# Patient Record
Sex: Female | Born: 1960 | Race: White | Hispanic: No | Marital: Married | Smoking: Never smoker
Health system: Southern US, Community
[De-identification: ages and names within clinical notes are randomized; demographics above are authoritative.]

## PROBLEM LIST (undated history)

## (undated) DIAGNOSIS — F40240 Claustrophobia: Secondary | ICD-10-CM

## (undated) DIAGNOSIS — I10 Essential (primary) hypertension: Secondary | ICD-10-CM

## (undated) DIAGNOSIS — D649 Anemia, unspecified: Secondary | ICD-10-CM

## (undated) DIAGNOSIS — T8859XA Other complications of anesthesia, initial encounter: Secondary | ICD-10-CM

## (undated) DIAGNOSIS — R768 Other specified abnormal immunological findings in serum: Secondary | ICD-10-CM

## (undated) DIAGNOSIS — Z9889 Other specified postprocedural states: Secondary | ICD-10-CM

## (undated) DIAGNOSIS — M150 Primary generalized (osteo)arthritis: Secondary | ICD-10-CM

## (undated) DIAGNOSIS — R112 Nausea with vomiting, unspecified: Secondary | ICD-10-CM

## (undated) DIAGNOSIS — E611 Iron deficiency: Secondary | ICD-10-CM

## (undated) DIAGNOSIS — G4730 Sleep apnea, unspecified: Secondary | ICD-10-CM

## (undated) DIAGNOSIS — J45909 Unspecified asthma, uncomplicated: Secondary | ICD-10-CM

## (undated) HISTORY — PX: REVERSE SHOULDER ARTHROPLASTY: SHX5054

## (undated) HISTORY — PX: COLONOSCOPY W/ POLYPECTOMY: SHX1380

## (undated) HISTORY — PX: VENTRAL HERNIA REPAIR: SHX424

## (undated) HISTORY — PX: EYE SURGERY: SHX253

## (undated) HISTORY — PX: ESOPHAGOGASTRODUODENOSCOPY: SHX1529

---

## 2021-01-05 ENCOUNTER — Other Ambulatory Visit: Payer: Self-pay

## 2021-01-05 ENCOUNTER — Encounter: Payer: Self-pay | Admitting: Family Medicine

## 2021-01-05 ENCOUNTER — Ambulatory Visit (INDEPENDENT_AMBULATORY_CARE_PROVIDER_SITE_OTHER): Payer: Medicaid Other | Admitting: Family Medicine

## 2021-01-05 VITALS — BP 146/74 | Ht 62.0 in | Wt 270.0 lb

## 2021-01-05 DIAGNOSIS — M170 Bilateral primary osteoarthritis of knee: Secondary | ICD-10-CM

## 2021-01-05 NOTE — Progress Notes (Signed)
  Lori Moody - 60 y.o. female MRN [id number]  Date of birth: 26-Mar-1961  SUBJECTIVE:  Including CC & ROS.  No chief complaint on file.   Lori Moody is a 60 y.o. female that is presenting with acute on chronic bilateral knee pain.  She has received gel injections in the past.  The last gel injection was January 4.  Has degenerative changes appreciated of each knee that have been revealed on previous imaging.  Has some buckling in the knee from time to time..   Review of Systems See HPI   HISTORY: Past Medical, Surgical, Social, and Family History Reviewed & Updated per EMR.   Pertinent Historical Findings include:  History reviewed. No pertinent past medical history.  History reviewed. No pertinent surgical history.  History reviewed. No pertinent family history.  Social History   Socioeconomic History   Marital status: Unknown    Spouse name: Not on file   Number of children: Not on file   Years of education: Not on file   Highest education level: Not on file  Occupational History   Not on file  Tobacco Use   Smoking status: Not on file   Smokeless tobacco: Not on file  Substance and Sexual Activity   Alcohol use: Not on file   Drug use: Not on file   Sexual activity: Not on file  Other Topics Concern   Not on file  Social History Narrative   Not on file   Social Determinants of Health   Financial Resource Strain: Not on file  Food Insecurity: Not on file  Transportation Needs: Not on file  Physical Activity: Not on file  Stress: Not on file  Social Connections: Not on file  Intimate Partner Violence: Not on file     PHYSICAL EXAM:  VS: BP (!) 146/74 (BP Location: Left Arm, Patient Position: Sitting, Cuff Size: Large)   Ht 5\' 2"  (1.575 m)   Wt 270 lb (122.5 kg)   BMI 49.38 kg/m  Physical Exam Gen: NAD, alert, cooperative with exam, well-appearing MSK:  Right and left knee: Normal range of motion. Tenderness to palpation along the joint  space. Normal strength resistance. Neurovascular intact     ASSESSMENT & PLAN:   Primary osteoarthritis of both knees Acute on chronic in nature.  Has done well with previous gel injections.  Last gel injection was January 4.  Degenerative changes appreciated on imaging. -Counseled on home exercise therapy and supportive care. -Pursue gel injection. -Hinged knee brace. -Could consider physical therapy or PRP.

## 2021-01-05 NOTE — Patient Instructions (Signed)
Nice to meet you Please try ice as needed  Please try the exercises   Please send me a message in MyChart with any questions or updates.  We will call once the gel injections are in.   --Dr. Jordan Likes

## 2021-01-06 DIAGNOSIS — M170 Bilateral primary osteoarthritis of knee: Secondary | ICD-10-CM | POA: Insufficient documentation

## 2021-01-06 NOTE — Assessment & Plan Note (Signed)
Acute on chronic in nature.  Has done well with previous gel injections.  Last gel injection was January 4.  Degenerative changes appreciated on imaging. -Counseled on home exercise therapy and supportive care. -Pursue gel injection. -Hinged knee brace. -Could consider physical therapy or PRP.

## 2021-01-07 ENCOUNTER — Telehealth: Payer: Self-pay | Admitting: *Deleted

## 2021-01-07 NOTE — Telephone Encounter (Signed)
Received SOB for pt's Gelsyn-3. Her plan covers the medication 100% and admin coverage 100%. I informed pt she can contact her plan as well to confirm benefits.   She wanted to ask for a specific different gel injection. I informed her Dr. Jordan Likes wanted her to have Gelsyn-3. She agreed to having her 1st Gelsyn on 01/19/21. OV scheduled.

## 2021-01-19 ENCOUNTER — Ambulatory Visit: Payer: Self-pay

## 2021-01-19 ENCOUNTER — Other Ambulatory Visit: Payer: Self-pay

## 2021-01-19 ENCOUNTER — Encounter: Payer: Self-pay | Admitting: Family Medicine

## 2021-01-19 ENCOUNTER — Ambulatory Visit (INDEPENDENT_AMBULATORY_CARE_PROVIDER_SITE_OTHER): Payer: Medicaid Other | Admitting: Family Medicine

## 2021-01-19 VITALS — BP 140/90 | Ht 62.0 in | Wt 270.0 lb

## 2021-01-19 DIAGNOSIS — M170 Bilateral primary osteoarthritis of knee: Secondary | ICD-10-CM

## 2021-01-19 NOTE — Patient Instructions (Signed)
Good to see you Please use ice as needed  Please send me a message in MyChart with any questions or updates.  Please see me back in 1 week.   --Dr. Donicia Druck  

## 2021-01-19 NOTE — Progress Notes (Signed)
Lori Moody - 60 y.o. female MRN [id number]  Date of birth: 11-19-1960  SUBJECTIVE:  Including CC & ROS.  No chief complaint on file.   Lori Moody is a 60 y.o. female that is presenting for gel injections.    Review of Systems See HPI   HISTORY: Past Medical, Surgical, Social, and Family History Reviewed & Updated per EMR.   Pertinent Historical Findings include:  History reviewed. No pertinent past medical history.  History reviewed. No pertinent surgical history.  History reviewed. No pertinent family history.  Social History   Socioeconomic History   Marital status: Unknown    Spouse name: Not on file   Number of children: Not on file   Years of education: Not on file   Highest education level: Not on file  Occupational History   Not on file  Tobacco Use   Smoking status: Not on file   Smokeless tobacco: Not on file  Substance and Sexual Activity   Alcohol use: Not on file   Drug use: Not on file   Sexual activity: Not on file  Other Topics Concern   Not on file  Social History Narrative   Not on file   Social Determinants of Health   Financial Resource Strain: Not on file  Food Insecurity: Not on file  Transportation Needs: Not on file  Physical Activity: Not on file  Stress: Not on file  Social Connections: Not on file  Intimate Partner Violence: Not on file     PHYSICAL EXAM:  VS: BP 140/90 (BP Location: Left Arm, Patient Position: Sitting, Cuff Size: Large)   Ht 5\' 2"  (1.575 m)   Wt 270 lb (122.5 kg)   BMI 49.38 kg/m  Physical Exam Gen: NAD, alert, cooperative with exam, well-appearing    Aspiration/Injection Procedure Note Lori Moody 09/15/1960  Procedure: Injection Indications: left knee pain   Procedure Details Consent: Risks of procedure as well as the alternatives and risks of each were explained to the (patient/caregiver).  Consent for procedure obtained. Time Out: Verified patient identification, verified procedure,  site/side was marked, verified correct patient position, special equipment/implants available, medications/allergies/relevent history reviewed, required imaging and test results available.  Performed.  The area was cleaned with iodine and alcohol swabs.    The left  knee superior lateral suprapatellar pouch was injected using 4 cc's of 1% lidocaine with a 22 1 1/2" needle.  The syringe was switched and a 16.8 mg/2 mL of Gelsyn 3 was injected. Ultrasound was used. Images were obtained in  Long views showing the injection.    A sterile dressing was applied.  Patient did tolerate procedure well.  Aspiration/Injection Procedure Note Lori Moody [birth year]  Procedure: Injection Indications: right knee pain   Procedure Details Consent: Risks of procedure as well as the alternatives and risks of each were explained to the (patient/caregiver).  Consent for procedure obtained. Time Out: Verified patient identification, verified procedure, site/side was marked, verified correct patient position, special equipment/implants available, medications/allergies/relevent history reviewed, required imaging and test results available.  Performed.  The area was cleaned with iodine and alcohol swabs.    The right knee superior lateral suprapatellar pouch was injected using 4 cc's of 1% lidocaine with a 22 1 1/2" needle.  The syringe was switched and a 16.8 mg/2 mL of Gelsyn 3 was injected. Ultrasound was used. Images were obtained in  Long views showing the injection.    A sterile dressing was applied.  Patient did tolerate procedure well.  ASSESSMENT & PLAN:   Primary osteoarthritis of both knees Completed Gelsyn injection today. - f/u in one week.

## 2021-01-19 NOTE — Assessment & Plan Note (Signed)
Completed Gelsyn injection today. - f/u in one week.

## 2021-01-26 ENCOUNTER — Encounter: Payer: Self-pay | Admitting: Family Medicine

## 2021-01-26 ENCOUNTER — Ambulatory Visit: Payer: Self-pay

## 2021-01-26 ENCOUNTER — Other Ambulatory Visit: Payer: Self-pay

## 2021-01-26 ENCOUNTER — Ambulatory Visit (INDEPENDENT_AMBULATORY_CARE_PROVIDER_SITE_OTHER): Payer: Medicaid Other | Admitting: Family Medicine

## 2021-01-26 DIAGNOSIS — M170 Bilateral primary osteoarthritis of knee: Secondary | ICD-10-CM

## 2021-01-26 NOTE — Patient Instructions (Signed)
Good to see you Please try ice  Please send me a message in MyChart with any questions or updates.  Please see me back in 1 week.   --Dr. Edwin Cherian  

## 2021-01-26 NOTE — Progress Notes (Signed)
Lori Moody - 60 y.o. female MRN [id number]  Date of birth: [birth year]  SUBJECTIVE:  Including CC & ROS.  No chief complaint on file.   Lori Moody is a 60 y.o. female that is  here for second gel injection.    Review of Systems See HPI   HISTORY: Past Medical, Surgical, Social, and Family History Reviewed & Updated per EMR.   Pertinent Historical Findings include:  History reviewed. No pertinent past medical history.  History reviewed. No pertinent surgical history.  History reviewed. No pertinent family history.  Social History   Socioeconomic History   Marital status: Unknown    Spouse name: Not on file   Number of children: Not on file   Years of education: Not on file   Highest education level: Not on file  Occupational History   Not on file  Tobacco Use   Smoking status: Not on file   Smokeless tobacco: Not on file  Substance and Sexual Activity   Alcohol use: Not on file   Drug use: Not on file   Sexual activity: Not on file  Other Topics Concern   Not on file  Social History Narrative   Not on file   Social Determinants of Health   Financial Resource Strain: Not on file  Food Insecurity: Not on file  Transportation Needs: Not on file  Physical Activity: Not on file  Stress: Not on file  Social Connections: Not on file  Intimate Partner Violence: Not on file     PHYSICAL EXAM:  VS: BP 130/82 (BP Location: Left Arm, Patient Position: Sitting, Cuff Size: Large)   Ht 5\' 2"  (1.575 m)   Wt 270 lb (122.5 kg)   BMI 49.38 kg/m  Physical Exam Gen: NAD, alert, cooperative with exam, well-appearing    Aspiration/Injection Procedure Note Lori Moody Feb 20, 1961  Procedure: Injection Indications: left knee pain   Procedure Details Consent: Risks of procedure as well as the alternatives and risks of each were explained to the (patient/caregiver).  Consent for procedure obtained. Time Out: Verified patient identification, verified procedure,  site/side was marked, verified correct patient position, special equipment/implants available, medications/allergies/relevent history reviewed, required imaging and test results available.  Performed.  The area was cleaned with iodine and alcohol swabs.    The left knee superior lateral suprapatellar pouch was injected using 4 cc's of 1% lidocaine with a 22 1 1/2" needle.  The syringe was switched and a 16.8 mg/2 mL of Gelsyn 3 was injected. Ultrasound was used. Images were obtained in  Long views showing the injection.    A sterile dressing was applied.  Patient did tolerate procedure well.  Aspiration/Injection Procedure Note Lori Moody 03-15-1961  Procedure: Injection Indications: right knee pain   Procedure Details Consent: Risks of procedure as well as the alternatives and risks of each were explained to the (patient/caregiver).  Consent for procedure obtained. Time Out: Verified patient identification, verified procedure, site/side was marked, verified correct patient position, special equipment/implants available, medications/allergies/relevent history reviewed, required imaging and test results available.  Performed.  The area was cleaned with iodine and alcohol swabs.    The right knee superior lateral suprapatellar pouch was injected using 4 cc's of 1% lidocaine with a 22 1 1/2" needle.  The syringe was switched and a 16.8 mg/2 mL of Gelsyn 3 was injected. Ultrasound was used. Images were obtained in  Long views showing the injection.    A sterile dressing was applied.  Patient did tolerate procedure well.  ASSESSMENT & PLAN:   Primary osteoarthritis of both knees Completed 2nd gel injection  - f/u in one week.

## 2021-01-26 NOTE — Assessment & Plan Note (Signed)
Completed 2nd gel injection  - f/u in one week.

## 2021-01-26 NOTE — Addendum Note (Signed)
Addended by: Annita Brod on: 01/26/2021 03:52 PM   Modules accepted: Orders

## 2021-02-02 ENCOUNTER — Ambulatory Visit (INDEPENDENT_AMBULATORY_CARE_PROVIDER_SITE_OTHER): Payer: Medicaid Other | Admitting: Family Medicine

## 2021-02-02 ENCOUNTER — Encounter: Payer: Self-pay | Admitting: Family Medicine

## 2021-02-02 ENCOUNTER — Ambulatory Visit: Payer: Self-pay

## 2021-02-02 ENCOUNTER — Other Ambulatory Visit: Payer: Self-pay

## 2021-02-02 VITALS — BP 148/82 | Ht 62.0 in | Wt 270.0 lb

## 2021-02-02 DIAGNOSIS — M170 Bilateral primary osteoarthritis of knee: Secondary | ICD-10-CM

## 2021-02-02 NOTE — Assessment & Plan Note (Signed)
Completed third gelsyn injection today - provided pennsaid samples  - counseled on home exercise therapy and supportive care

## 2021-02-02 NOTE — Progress Notes (Signed)
  Lori Moody - 60 y.o. female MRN [id number]  Date of birth: 12-Feb-1961  SUBJECTIVE:  Including CC & ROS.  No chief complaint on file.   Lori Moody is a 60 y.o. female that is  is her for third gel injection.   Review of Systems See HPI   HISTORY: Past Medical, Surgical, Social, and Family History Reviewed & Updated per EMR.   Pertinent Historical Findings include:  No past medical history on file.  No past surgical history on file.  No family history on file.  Social History   Socioeconomic History   Marital status: Married    Spouse name: Not on file   Number of children: Not on file   Years of education: Not on file   Highest education level: Not on file  Occupational History   Not on file  Tobacco Use   Smoking status: Not on file   Smokeless tobacco: Not on file  Substance and Sexual Activity   Alcohol use: Not on file   Drug use: Not on file   Sexual activity: Not on file  Other Topics Concern   Not on file  Social History Narrative   Not on file   Social Determinants of Health   Financial Resource Strain: Not on file  Food Insecurity: Not on file  Transportation Needs: Not on file  Physical Activity: Not on file  Stress: Not on file  Social Connections: Not on file  Intimate Partner Violence: Not on file     PHYSICAL EXAM:  VS: There were no vitals taken for this visit. Physical Exam Gen: NAD, alert, cooperative with exam, well-appearing    Aspiration/Injection Procedure Note Lori Moody [birth year]  Procedure: Injection Indications: left knee pain   Procedure Details Consent: Risks of procedure as well as the alternatives and risks of each were explained to the (patient/caregiver).  Consent for procedure obtained. Time Out: Verified patient identification, verified procedure, site/side was marked, verified correct patient position, special equipment/implants available, medications/allergies/relevent history reviewed, required imaging  and test results available.  Performed.  The area was cleaned with iodine and alcohol swabs.    The left knee superior lateral suprapatellar pouch was injected using 4 cc's of 1% lidocaine with a 22 1 1/2" needle.  The syringe was switched and a 16.8 mg/2 mL of Gelsyn 3 was injected. Ultrasound was used. Images were obtained in  Long views showing the injection.    A sterile dressing was applied.  Patient did tolerate procedure well.  Aspiration/Injection Procedure Note Lori Moody Jul 01, 1961  Procedure: Injection Indications: right knee pain   Procedure Details Consent: Risks of procedure as well as the alternatives and risks of each were explained to the (patient/caregiver).  Consent for procedure obtained. Time Out: Verified patient identification, verified procedure, site/side was marked, verified correct patient position, special equipment/implants available, medications/allergies/relevent history reviewed, required imaging and test results available.  Performed.  The area was cleaned with iodine and alcohol swabs.    The right knee superior lateral suprapatellar pouch was injected using 4 cc's of 1% lidocaine with a 22 1 1/2" needle.  The syringe was switched and a 16.8 mg/2 mL of Gelsyn 3 was injected. Ultrasound was used. Images were obtained in  Long views showing the injection.    A sterile dressing was applied.  Patient did tolerate procedure well.   ASSESSMENT & PLAN:   No problem-specific Assessment & Plan notes found for this encounter.

## 2021-02-02 NOTE — Patient Instructions (Signed)
Good to see you Please try ice   Please send me a message in MyChart with any questions or updates.  Please see me back in 4 weeks.   --Dr. Harbour Nordmeyer  

## 2021-05-09 ENCOUNTER — Ambulatory Visit: Payer: Medicaid Other | Admitting: Family Medicine

## 2021-05-10 ENCOUNTER — Ambulatory Visit: Payer: Medicaid Other | Admitting: Family Medicine

## 2021-05-10 NOTE — Progress Notes (Deleted)
  Lori Moody - 60 y.o. female MRN [id number]  Date of birth: 1960/09/14  SUBJECTIVE:  Including CC & ROS.  No chief complaint on file.   Lori Moody is a 60 y.o. female that is  ***.  ***   Review of Systems See HPI   HISTORY: Past Medical, Surgical, Social, and Family History Reviewed & Updated per EMR.   Pertinent Historical Findings include:  No past medical history on file.  No past surgical history on file.  No family history on file.  Social History   Socioeconomic History   Marital status: Married    Spouse name: Not on file   Number of children: Not on file   Years of education: Not on file   Highest education level: Not on file  Occupational History   Not on file  Tobacco Use   Smoking status: Not on file   Smokeless tobacco: Not on file  Substance and Sexual Activity   Alcohol use: Not on file   Drug use: Not on file   Sexual activity: Not on file  Other Topics Concern   Not on file  Social History Narrative   Not on file   Social Determinants of Health   Financial Resource Strain: Not on file  Food Insecurity: Not on file  Transportation Needs: Not on file  Physical Activity: Not on file  Stress: Not on file  Social Connections: Not on file  Intimate Partner Violence: Not on file     PHYSICAL EXAM:  VS: There were no vitals taken for this visit. Physical Exam Gen: NAD, alert, cooperative with exam, well-appearing MSK:  ***      ASSESSMENT & PLAN:   No problem-specific Assessment & Plan notes found for this encounter.

## 2021-05-11 ENCOUNTER — Telehealth: Payer: Self-pay | Admitting: Family Medicine

## 2021-05-11 DIAGNOSIS — S39012A Strain of muscle, fascia and tendon of lower back, initial encounter: Secondary | ICD-10-CM

## 2021-05-11 NOTE — Telephone Encounter (Signed)
Patient having radicular pain with low back pain.  We will place future lumbar spine x-rays  Myra Rude, MD [facility] 05/11/2021, 1:46 PM

## 2021-05-13 ENCOUNTER — Ambulatory Visit (INDEPENDENT_AMBULATORY_CARE_PROVIDER_SITE_OTHER): Payer: Medicaid Other | Admitting: Family Medicine

## 2021-05-13 ENCOUNTER — Ambulatory Visit (HOSPITAL_BASED_OUTPATIENT_CLINIC_OR_DEPARTMENT_OTHER)
Admission: RE | Admit: 2021-05-13 | Discharge: 2021-05-13 | Disposition: A | Discharge status: Home / Self Care | Payer: Medicaid Other | Source: Ambulatory Visit | Attending: Family Medicine | Admitting: Family Medicine

## 2021-05-13 ENCOUNTER — Other Ambulatory Visit: Payer: Self-pay

## 2021-05-13 ENCOUNTER — Encounter: Payer: Self-pay | Admitting: Family Medicine

## 2021-05-13 VITALS — BP 134/72 | Ht 62.0 in | Wt 266.0 lb

## 2021-05-13 DIAGNOSIS — M5416 Radiculopathy, lumbar region: Secondary | ICD-10-CM | POA: Diagnosis not present

## 2021-05-13 DIAGNOSIS — S39012A Strain of muscle, fascia and tendon of lower back, initial encounter: Secondary | ICD-10-CM | POA: Insufficient documentation

## 2021-05-13 DIAGNOSIS — M47819 Spondylosis without myelopathy or radiculopathy, site unspecified: Secondary | ICD-10-CM | POA: Insufficient documentation

## 2021-05-13 NOTE — Assessment & Plan Note (Signed)
Has changes of the SI joints which could be contributing to the pain that she is experiencing. -Counseled on home exercise therapy and supportive care. -Referral to physical therapy. -Could consider further imaging

## 2021-05-13 NOTE — Progress Notes (Signed)
  Lori Moody - 60 y.o. female MRN [id number]  Date of birth: Jun 11, 1961  SUBJECTIVE:  Including CC & ROS.  No chief complaint on file.   Lori Moody is a 60 y.o. female that is presenting with acute on chronic low back pain with radicular pain down the left leg.  Symptoms are acutely occurring.  No history of surgery..  Independent review of the lumbar spine x-ray from today shows degenerative changes of the SI joint in the thoracic with excessive kyphosis   Review of Systems See HPI   HISTORY: Past Medical, Surgical, Social, and Family History Reviewed & Updated per EMR.   Pertinent Historical Findings include:  History reviewed. No pertinent past medical history.  History reviewed. No pertinent surgical history.  History reviewed. No pertinent family history.  Social History   Socioeconomic History   Marital status: Married    Spouse name: Not on file   Number of children: Not on file   Years of education: Not on file   Highest education level: Not on file  Occupational History   Not on file  Tobacco Use   Smoking status: Not on file   Smokeless tobacco: Not on file  Substance and Sexual Activity   Alcohol use: Not on file   Drug use: Not on file   Sexual activity: Not on file  Other Topics Concern   Not on file  Social History Narrative   Not on file   Social Determinants of Health   Financial Resource Strain: Not on file  Food Insecurity: Not on file  Transportation Needs: Not on file  Physical Activity: Not on file  Stress: Not on file  Social Connections: Not on file  Intimate Partner Violence: Not on file     PHYSICAL EXAM:  VS: BP 134/72 (BP Location: Left Arm, Patient Position: Sitting)   Ht 5\' 2"  (1.575 m)   Wt 266 lb (120.7 kg)   BMI 48.65 kg/m  Physical Exam Gen: NAD, alert, cooperative with exam, well-appearing      ASSESSMENT & PLAN:   Lumbar radiculopathy Has changes of the SI joints which could be contributing to the pain  that she is experiencing. -Counseled on home exercise therapy and supportive care. -Referral to physical therapy. -Could consider further imaging

## 2021-05-13 NOTE — Patient Instructions (Signed)
Good to see you Please use heat  Please try the exercises  Please try physical therapy   Please send me a message in MyChart with any questions or updates.  Please see me back in 6 weeks.   --Dr. Jordan Likes

## 2021-05-18 ENCOUNTER — Telehealth: Payer: Self-pay | Admitting: Family Medicine

## 2021-05-18 NOTE — Telephone Encounter (Signed)
Unable to leave VM for patient. If she calls back please have her speak with a nurse/CMA and inform that her xray is showing the degenerative changes in the thoracic spine and the excessive curve in the lumbar region.   If any questions then please take the best time and phone number to call and I will try to call her back.   Myra Rude, MD [facility] 05/18/2021, 8:12 AM

## 2021-06-29 ENCOUNTER — Ambulatory Visit: Payer: Medicaid Other | Admitting: Family Medicine

## 2021-08-01 ENCOUNTER — Ambulatory Visit: Payer: Medicaid Other | Admitting: Family Medicine

## 2021-08-08 ENCOUNTER — Ambulatory Visit (INDEPENDENT_AMBULATORY_CARE_PROVIDER_SITE_OTHER): Payer: Medicaid Other | Admitting: Family Medicine

## 2021-08-08 ENCOUNTER — Encounter: Payer: Self-pay | Admitting: Family Medicine

## 2021-08-08 VITALS — BP 130/62 | Ht 62.0 in | Wt 266.0 lb

## 2021-08-08 DIAGNOSIS — M5416 Radiculopathy, lumbar region: Secondary | ICD-10-CM | POA: Diagnosis not present

## 2021-08-08 DIAGNOSIS — M170 Bilateral primary osteoarthritis of knee: Secondary | ICD-10-CM

## 2021-08-08 NOTE — Progress Notes (Addendum)
°  Lori Moody - 61 y.o. female MRN [id number]  Date of birth: 1960-11-22  SUBJECTIVE:  Including CC & ROS.  No chief complaint on file.   Lori Moody is a 61 y.o. female that is presenting with acute on chronic bilateral knee pain.  She has done well with previous gel injections.  She is also presenting with acute on chronic low back pain with radicular pain down the left leg.  She has completed more than 6 weeks of home exercise program that was physician directed.  Has tried medications and therapy.  Independent review of the lumbar spine x-ray from 05/13/2021 shows mild facet hypertrophy.  Review of Systems See HPI   HISTORY: Past Medical, Surgical, Social, and Family History Reviewed & Updated per EMR.   Pertinent Historical Findings include:  History reviewed. No pertinent past medical history.  History reviewed. No pertinent surgical history.   PHYSICAL EXAM:  VS: BP 130/62 (BP Location: Left Arm, Patient Position: Sitting)    Ht 5\' 2"  (1.575 m)    Wt 266 lb (120.7 kg)    BMI 48.65 kg/m  Physical Exam Gen: NAD, alert, cooperative with exam, well-appearing MSK: Neurovascularly intact       ASSESSMENT & PLAN:   Lumbar radiculopathy Acute on chronic in nature.  Having radicular pain that is suggestive of nerve impingement.  She has received osteopathic manipulation therapy starting 06/24/2021 to present.  She has been having home exercise supervision by physician starting 05/13/2021 to present.  Having altered sensation in the lower leg with weakness of the left foot. -Counseled on home exercise therapy and supportive care. - MRI of lumbar spine to evaluate for nerve impingement and consideration of epidural    Primary osteoarthritis of both knees Acute on chronic in nature.  - counseled on home exercise therapy and supportive care - pursue gel injections.

## 2021-08-08 NOTE — Assessment & Plan Note (Signed)
Acute on chronic in nature.  - counseled on home exercise therapy and supportive care - pursue gel injections.

## 2021-08-08 NOTE — Assessment & Plan Note (Addendum)
Acute on chronic in nature.  Having radicular pain that is suggestive of nerve impingement.  She has received osteopathic manipulation therapy starting 06/24/2021 to present.  She has been having home exercise supervision by physician starting 05/13/2021 to present.  Having altered sensation in the lower leg with weakness of the left foot. -Counseled on home exercise therapy and supportive care. - MRI of lumbar spine to evaluate for nerve impingement and consideration of epidural

## 2021-08-08 NOTE — Patient Instructions (Signed)
Good to see you We'll call you when the synvisc is in.   Please send me a message in MyChart with any questions or updates.  We'll setup a virtual visit once the MRi is resulted.   --Dr. Jordan Likes

## 2021-08-10 ENCOUNTER — Other Ambulatory Visit: Payer: Self-pay | Admitting: *Deleted

## 2021-08-15 ENCOUNTER — Ambulatory Visit: Payer: Medicaid Other | Admitting: Family Medicine

## 2021-08-18 ENCOUNTER — Encounter: Payer: Self-pay | Admitting: Family Medicine

## 2021-08-19 ENCOUNTER — Ambulatory Visit: Payer: Medicaid Other | Admitting: Family Medicine

## 2021-08-22 ENCOUNTER — Telehealth: Payer: Self-pay | Admitting: *Deleted

## 2021-08-22 ENCOUNTER — Ambulatory Visit: Payer: Medicaid Other | Admitting: Family Medicine

## 2021-08-22 NOTE — Telephone Encounter (Signed)
I called [employer]- spoke with Earlie Server.  Synvisc bilateral knee injections approved 08/19/21-10/17/21. PA # [id number]. Patient is scheduled 08/23/21.

## 2021-08-23 ENCOUNTER — Ambulatory Visit: Payer: Self-pay

## 2021-08-23 ENCOUNTER — Encounter: Payer: Self-pay | Admitting: Family Medicine

## 2021-08-23 ENCOUNTER — Ambulatory Visit (INDEPENDENT_AMBULATORY_CARE_PROVIDER_SITE_OTHER): Payer: Medicaid Other | Admitting: Family Medicine

## 2021-08-23 VITALS — BP 140/72 | Ht 62.0 in | Wt 266.0 lb

## 2021-08-23 DIAGNOSIS — M170 Bilateral primary osteoarthritis of knee: Secondary | ICD-10-CM

## 2021-08-23 MED ORDER — DICLOFENAC SODIUM 2 % EX SOLN: 1.0000 "application " | Freq: Two times a day (BID) | CUTANEOUS | 2 refills | Status: DC

## 2021-08-23 NOTE — Assessment & Plan Note (Signed)
Completed gel injections today.

## 2021-08-23 NOTE — Progress Notes (Signed)
°  Lori Moody - 61 y.o. female MRN [id number]  Date of birth: 04/13/1961  SUBJECTIVE:  Including CC & ROS.  No chief complaint on file.   Lori Moody is a 61 y.o. female that is here for gel injection.   Review of Systems See HPI   HISTORY: Past Medical, Surgical, Social, and Family History Reviewed & Updated per EMR.   Pertinent Historical Findings include:  History reviewed. No pertinent past medical history.  History reviewed. No pertinent surgical history.   PHYSICAL EXAM:  VS: BP 140/72 (BP Location: Left Arm, Patient Position: Sitting)    Ht 5\' 2"  (1.575 m)    Wt 266 lb (120.7 kg)    BMI 48.65 kg/m  Physical Exam Gen: NAD, alert, cooperative with exam, well-appearing MSK:  Neurovascularly intact     Aspiration/Injection Procedure Note Lori Moody 05-26-1961  Procedure: Injection Indications: left knee pain   Procedure Details Consent: Risks of procedure as well as the alternatives and risks of each were explained to the (patient/caregiver).  Consent for procedure obtained. Time Out: Verified patient identification, verified procedure, site/side was marked, verified correct patient position, special equipment/implants available, medications/allergies/relevent history reviewed, required imaging and test results available.  Performed.  The area was cleaned with iodine and alcohol swabs.    The left knee superior lateral suprapatellar pouch was injected using 4 cc's of 1% lidocaine with a 21 2" needle.  The syringe was switched and a 41mL of synvisc was injected. Ultrasound was used. Images were obtained in  Long views showing the injection.    A sterile dressing was applied.  Patient did tolerate procedure well.  Aspiration/Injection Procedure Note Lori Moody 10/09/1960  Procedure: Injection Indications: right knee pain   Procedure Details Consent: Risks of procedure as well as the alternatives and risks of each were explained to the  (patient/caregiver).  Consent for procedure obtained. Time Out: Verified patient identification, verified procedure, site/side was marked, verified correct patient position, special equipment/implants available, medications/allergies/relevent history reviewed, required imaging and test results available.  Performed.  The area was cleaned with iodine and alcohol swabs.    The right knee superior lateral suprapatellar pouch was injected using 4 cc's of 1% lidocaine with a 21 2" needle.  The syringe was switched and a 46mL of synvisc was injected. Ultrasound was used. Images were obtained in  Long views showing the injection.    A sterile dressing was applied.  Patient did tolerate procedure well.  ASSESSMENT & PLAN:   Primary osteoarthritis of both knees Completed gel injections today.

## 2021-08-23 NOTE — Patient Instructions (Signed)
Good to see you Please use ice as needed  Please send me a message in MyChart with any questions or updates.  Please see me back in 1 week.   --Dr. Kamden Stanislaw  

## 2021-08-30 ENCOUNTER — Ambulatory Visit: Payer: Self-pay

## 2021-08-30 ENCOUNTER — Encounter: Payer: Self-pay | Admitting: Family Medicine

## 2021-08-30 ENCOUNTER — Ambulatory Visit (INDEPENDENT_AMBULATORY_CARE_PROVIDER_SITE_OTHER): Payer: Medicaid Other | Admitting: Family Medicine

## 2021-08-30 VITALS — BP 138/72 | Ht 62.0 in | Wt 266.0 lb

## 2021-08-30 DIAGNOSIS — M170 Bilateral primary osteoarthritis of knee: Secondary | ICD-10-CM

## 2021-08-30 NOTE — Assessment & Plan Note (Signed)
Gel injections today ° °

## 2021-08-30 NOTE — Progress Notes (Signed)
°  Lori Moody - 61 y.o. female MRN [id number]  Date of birth: October 11, 1960  SUBJECTIVE:  Including CC & ROS.  No chief complaint on file.   Lori Moody is a 61 y.o. female that is here for gel injections.    Review of Systems See HPI   HISTORY: Past Medical, Surgical, Social, and Family History Reviewed & Updated per EMR.   Pertinent Historical Findings include:  History reviewed. No pertinent past medical history.  History reviewed. No pertinent surgical history.   PHYSICAL EXAM:  VS: BP 138/72 (BP Location: Left Arm, Patient Position: Sitting)    Ht 5\' 2"  (1.575 m)    Wt 266 lb (120.7 kg)    BMI 48.65 kg/m  Physical Exam Gen: NAD, alert, cooperative with exam, well-appearing MSK:  Neurovascularly intact    Aspiration/Injection Procedure Note Lori Moody 1960/12/22   Procedure: Injection Indications: left knee pain    Procedure Details Consent: Risks of procedure as well as the alternatives and risks of each were explained to the (patient/caregiver).  Consent for procedure obtained. Time Out: Verified patient identification, verified procedure, site/side was marked, verified correct patient position, special equipment/implants available, medications/allergies/relevent history reviewed, required imaging and test results available.  Performed.  The area was cleaned with iodine and alcohol swabs.     The left knee superior lateral suprapatellar pouch was injected using 4 cc's of 1% lidocaine with a 21 2" needle.  The syringe was switched and a 93mL of synvisc was injected. Ultrasound was used. Images were obtained in  Long views showing the injection.     A sterile dressing was applied.   Patient did tolerate procedure well.  Aspiration/Injection Procedure Note Lori Moody 1960-07-26   Procedure: Injection Indications: right knee pain    Procedure Details Consent: Risks of procedure as well as the alternatives and risks of each were explained to the  (patient/caregiver).  Consent for procedure obtained. Time Out: Verified patient identification, verified procedure, site/side was marked, verified correct patient position, special equipment/implants available, medications/allergies/relevent history reviewed, required imaging and test results available.  Performed.  The area was cleaned with iodine and alcohol swabs.     The right knee superior lateral suprapatellar pouch was injected using 4 cc's of 1% lidocaine with a 21 2" needle.  The syringe was switched and a 84mL of synvisc was injected. Ultrasound was used. Images were obtained in  Long views showing the injection.     A sterile dressing was applied.   Patient did tolerate procedure well.   ASSESSMENT & PLAN:   Primary osteoarthritis of both knees Gel injections today.

## 2021-08-30 NOTE — Patient Instructions (Signed)
Good to see you Please use ice as needed  Please send me a message in MyChart with any questions or updates.  Please see me back in 1 week.   --Dr. Elize Pinon  

## 2021-09-02 ENCOUNTER — Other Ambulatory Visit: Payer: Self-pay

## 2021-09-02 ENCOUNTER — Encounter (HOSPITAL_COMMUNITY): Payer: Self-pay | Admitting: *Deleted

## 2021-09-02 NOTE — Progress Notes (Addendum)
Lori Moody denies chest pain or shortness of breath. Patient denies having any s/s of Covid in her household.  Patient denies any known exposure to Covid. Lori Moody does experience shortness of breath when Iron stores are low. Patient is having a Iron transfusion today.  Lori Moody will complete a round of Augmentin on Monday, patient  is taking it for sinuitis. Lori Moody was seen at a [facility] not her PCP's office. Patient states she feels better.   Lori Moody has a history of HTN, patient was taking Losartan until late January 2023, patient complained of dizziness, she felt as if she was going to pass out. Lori Moody was seen by PCP, Dr. Porfirio Mylar  on 07/3021.  Patient said that PCP instructed her to stop Losartan he ordered an ECHO , and labs to be done- in the future. Lori Moody states that she is scheduled for ECHO on 09/12/21. I spoke with Rica Mast, NP, she is reviewing chart.  Lori Moody has a history Type II diabetes, Prediabetes; patient said she has neither now.  The last A1C I found was from 01/2021, it was 5.4. A1C is a lab that PCP ordered for the future. Lori Moody checks CBG 2 times a week, it runs around 80. I instructed patient to check CBG after awaking and every 2 hours until arrival  to the hospital.  I Instructed patient if CBG is less than 70 to take drink 1/2 cup of a clear juice. Recheck CBG in 15 minutes if CBG is not over 70 call, pre- op desk at [phone] for further instructions.   Lori Moody  stated t body has to be covered at all times, all except hands and face.I informed patient that patient's are not allowed to wear their owe clothing in MRI. I suggested that staff covers patient with sheets or blankets,  Lori Moody responded, "that will not work, they could come open."  I suggested that staff can tape the sheets/blankets on.  Lori Moody said," no I will bring my own clothes, you will see  that it will work."  I spoke with Sherian Rein RN,MSN, AD of pre- op unit.

## 2021-09-02 NOTE — Progress Notes (Signed)
Anesthesia Chart Review:  Pt is a same day work up   Case: [id number] Date/Time: 09/06/21 0945   Procedure: MRI LUMBAR SPINE WITHOUT CONTRAST   Anesthesia type: General   Pre-op diagnosis: LUMBER RADICULOPATHY   [facility]   Surgeons: Radiologist, Medication, MD       DISCUSSION: Pt is 61 years old with hx HTN, OSA, asthma, anemia  Pt saw PCP 08/22/21. C/o dizziness, losartan stopped. Pt reports to pre-admission testing RN today she no longer has dizziness. Pt also c/o DOE x 1 month and pedal edema. Echo ordered, is scheduled for 09/12/21.   Reviewed case with Dr. Glade Stanford   PROVIDERS: - PCP is Spero Geralds, MD (notes in care everywhere)    LABS: Labs reviewed: Acceptable for surgery. - CBC w/diff 08/16/21 (care everywhere) with H/H 11.7/35.4 - BMP 08/16/21 (care everywhere) is normal   EKG: Will be obtained day of surgery    CV: Echo 10/02/19:  - The left ventricle is normal in size. There is borderline concentric left ventricular hypertrophy. Left ventricular systolic function is normal. Ejection Fraction = 55 - 60%. Normal  diastolic function. No regional wall motion abnormalities noted.  - The right ventricle is normal in size and function.  - Right atrial size is normal.  - There is  borderline  left atrial enlargement.  - No significant valvular regurgitation.  Past Medical History:  Diagnosis Date   Anemia    Asthma    allergic   Claustrophobia    Complication of anesthesia    Elevated IgE level    Hypertension    Iron deficiency    09/02/21- - treating with Iron infusions   PONV (postoperative nausea and vomiting)    Primary generalized (osteo)arthritis    Sleep apnea     Past Surgical History:  Procedure Laterality Date   COLONOSCOPY W/ POLYPECTOMY     ESOPHAGOGASTRODUODENOSCOPY     EYE SURGERY Bilateral    cataracts   REVERSE SHOULDER ARTHROPLASTY Right    VENTRAL HERNIA REPAIR     x 2     MEDICATIONS: No current facility-administered medications for this encounter.    acetaminophen (TYLENOL) 650 MG CR tablet   albuterol (VENTOLIN HFA) 108 (90 Base) MCG/ACT inhaler   amoxicillin-clavulanate (AUGMENTIN) 875-125 MG tablet   celecoxib (CELEBREX) 200 MG capsule   Cholecalciferol (DIALYVITE VITAMIN D 5000) 125 MCG (5000 UT) capsule   Diclofenac Sodium (PENNSAID) 2 % SOLN   fluticasone (FLOVENT HFA) 110 MCG/ACT inhaler   Lidocaine 4 % PTCH   Multiple Vitamins-Minerals (ALIVE ONCE DAILY WOMENS 50+ PO)   naproxen sodium (ALEVE) 220 MG tablet   nystatin cream (MYCOSTATIN)   Olopatadine HCl 0.2 % SOLN   Polyethyl Glycol-Propyl Glycol (SYSTANE OP)   Polyethyl Glycol-Propyl Glycol (SYSTANE) 0.4-0.3 % GEL ophthalmic gel   TURMERIC PO   vitamin B-12 (CYANOCOBALAMIN) 1000 MCG tablet   zinc gluconate 50 MG tablet    If EKG acceptable day of surgery, I anticipate pt can proceed with surgery as scheduled.  Rica Mast, PhD, FNP-BC [facility]/Anesthesiology Phone: [phone] 09/02/2021 4:15 PM

## 2021-09-02 NOTE — Anesthesia Preprocedure Evaluation (Addendum)
Anesthesia Evaluation  Patient identified by MRN, date of birth, ID band Patient awake    Reviewed: Allergy & Precautions, NPO status , Patient's Chart, lab work & pertinent test results  Airway Mallampati: II  TM Distance: >3 FB Neck ROM: Full    Dental no notable dental hx.    Pulmonary asthma , sleep apnea ,    Pulmonary exam normal breath sounds clear to auscultation       Cardiovascular hypertension, Normal cardiovascular exam Rhythm:Regular Rate:Normal  ECG: NSR, rate 88   Neuro/Psych Anxiety  Neuromuscular disease    GI/Hepatic negative GI ROS, Neg liver ROS,   Endo/Other  Morbid obesity  Renal/GU negative Renal ROS     Musculoskeletal  (+) Arthritis ,   Abdominal (+) + obese,   Peds  Hematology negative hematology ROS (+)   Anesthesia Other Findings LUMBER RADICULOPATHY  Reproductive/Obstetrics                           Anesthesia Physical Anesthesia Plan  ASA: 3  Anesthesia Plan: General   Post-op Pain Management:    Induction: Intravenous  PONV Risk Score and Plan: 3 and Ondansetron, Midazolam, Dexamethasone and Treatment may vary due to age or medical condition  Airway Management Planned: Oral ETT and Video Laryngoscope Planned  Additional Equipment:   Intra-op Plan:   Post-operative Plan: Extubation in OR  Informed Consent: I have reviewed the patients History and Physical, chart, labs and discussed the procedure including the risks, benefits and alternatives for the proposed anesthesia with the patient or authorized representative who has indicated his/her understanding and acceptance.     Dental advisory given  Plan Discussed with: CRNA  Anesthesia Plan Comments: (Reviewed APP note by Joslyn Hy, FNP )       Anesthesia Quick Evaluation

## 2021-09-05 NOTE — Progress Notes (Signed)
Pt given updated diabetic instructions, as well as arrival time for MRI with anesthesia on 09/06/21 at [facility].

## 2021-09-06 ENCOUNTER — Ambulatory Visit (HOSPITAL_COMMUNITY)
Admission: RE | Admit: 2021-09-06 | Discharge: 2021-09-06 | Disposition: A | Discharge status: Home / Self Care | Payer: Medicaid Other | Attending: Family Medicine | Admitting: Family Medicine

## 2021-09-06 ENCOUNTER — Other Ambulatory Visit: Payer: Self-pay

## 2021-09-06 ENCOUNTER — Ambulatory Visit (HOSPITAL_BASED_OUTPATIENT_CLINIC_OR_DEPARTMENT_OTHER): Payer: Medicaid Other | Admitting: Emergency Medicine

## 2021-09-06 ENCOUNTER — Encounter (HOSPITAL_COMMUNITY): Admission: RE | Disposition: A | Discharge status: Home / Self Care | Payer: Self-pay | Source: Home / Self Care

## 2021-09-06 ENCOUNTER — Ambulatory Visit (HOSPITAL_COMMUNITY)
Admission: RE | Admit: 2021-09-06 | Discharge: 2021-09-06 | Disposition: A | Discharge status: Home / Self Care | Payer: Medicaid Other | Source: Ambulatory Visit | Attending: Family Medicine | Admitting: Family Medicine

## 2021-09-06 ENCOUNTER — Encounter (HOSPITAL_COMMUNITY): Payer: Self-pay

## 2021-09-06 ENCOUNTER — Ambulatory Visit (HOSPITAL_COMMUNITY): Payer: Medicaid Other | Admitting: Emergency Medicine

## 2021-09-06 DIAGNOSIS — F419 Anxiety disorder, unspecified: Secondary | ICD-10-CM

## 2021-09-06 DIAGNOSIS — M5416 Radiculopathy, lumbar region: Secondary | ICD-10-CM

## 2021-09-06 DIAGNOSIS — M4726 Other spondylosis with radiculopathy, lumbar region: Secondary | ICD-10-CM | POA: Diagnosis not present

## 2021-09-06 DIAGNOSIS — I10 Essential (primary) hypertension: Secondary | ICD-10-CM

## 2021-09-06 DIAGNOSIS — G4730 Sleep apnea, unspecified: Secondary | ICD-10-CM

## 2021-09-06 HISTORY — DX: Sleep apnea, unspecified: G47.30

## 2021-09-06 HISTORY — DX: Nausea with vomiting, unspecified: R11.2

## 2021-09-06 HISTORY — DX: Other complications of anesthesia, initial encounter: T88.59XA

## 2021-09-06 HISTORY — DX: Anemia, unspecified: D64.9

## 2021-09-06 HISTORY — PX: RADIOLOGY WITH ANESTHESIA: SHX6223

## 2021-09-06 HISTORY — DX: Primary generalized (osteo)arthritis: M15.0

## 2021-09-06 HISTORY — DX: Other specified postprocedural states: Z98.890

## 2021-09-06 HISTORY — DX: Unspecified asthma, uncomplicated: J45.909

## 2021-09-06 HISTORY — DX: Claustrophobia: F40.240

## 2021-09-06 HISTORY — DX: Iron deficiency: E61.1

## 2021-09-06 HISTORY — DX: Essential (primary) hypertension: I10

## 2021-09-06 HISTORY — DX: Other specified abnormal immunological findings in serum: R76.8

## 2021-09-06 SURGERY — MRI WITH ANESTHESIA
Anesthesia: General

## 2021-09-06 MED ORDER — PHENYLEPHRINE 40 MCG/ML (10ML) SYRINGE FOR IV PUSH (FOR BLOOD PRESSURE SUPPORT)
PREFILLED_SYRINGE | INTRAVENOUS | Status: DC | PRN
  Administered 2021-09-06: 120 ug via INTRAVENOUS

## 2021-09-06 MED ORDER — CHLORHEXIDINE GLUCONATE 0.12 % MT SOLN
15.0000 mL | Freq: Once | OROMUCOSAL | Status: AC
  Administered 2021-09-06: 15 mL via OROMUCOSAL
  Filled 2021-09-06: qty 15

## 2021-09-06 MED ORDER — SUGAMMADEX SODIUM 200 MG/2ML IV SOLN
INTRAVENOUS | Status: DC | PRN
  Administered 2021-09-06: 400 mg via INTRAVENOUS

## 2021-09-06 MED ORDER — MIDAZOLAM HCL 2 MG/2ML IJ SOLN
INTRAMUSCULAR | Status: AC
  Filled 2021-09-06: qty 2

## 2021-09-06 MED ORDER — FENTANYL CITRATE (PF) 100 MCG/2ML IJ SOLN: 25.0000 ug | INTRAMUSCULAR | Status: DC | PRN

## 2021-09-06 MED ORDER — SUCCINYLCHOLINE CHLORIDE 200 MG/10ML IV SOSY
PREFILLED_SYRINGE | INTRAVENOUS | Status: DC | PRN
Start: 2021-09-06 — End: 2021-09-06
  Administered 2021-09-06: 120 mg via INTRAVENOUS

## 2021-09-06 MED ORDER — PROPOFOL 10 MG/ML IV BOLUS
INTRAVENOUS | Status: DC | PRN
  Administered 2021-09-06: 50 mg via INTRAVENOUS
  Administered 2021-09-06: 150 mg via INTRAVENOUS

## 2021-09-06 MED ORDER — ROCURONIUM BROMIDE 10 MG/ML (PF) SYRINGE
PREFILLED_SYRINGE | INTRAVENOUS | Status: DC | PRN
  Administered 2021-09-06: 20 mg via INTRAVENOUS
  Administered 2021-09-06: 30 mg via INTRAVENOUS

## 2021-09-06 MED ORDER — KETOROLAC TROMETHAMINE 30 MG/ML IJ SOLN: 30.0000 mg | Freq: Once | INTRAMUSCULAR | Status: DC

## 2021-09-06 MED ORDER — PROMETHAZINE HCL 25 MG/ML IJ SOLN: 6.2500 mg | INTRAMUSCULAR | Status: DC | PRN

## 2021-09-06 MED ORDER — AMISULPRIDE (ANTIEMETIC) 5 MG/2ML IV SOLN: 10.0000 mg | Freq: Once | INTRAVENOUS | Status: DC | PRN

## 2021-09-06 MED ORDER — LIDOCAINE 2% (20 MG/ML) 5 ML SYRINGE
INTRAMUSCULAR | Status: DC | PRN
  Administered 2021-09-06: 80 mg via INTRAVENOUS

## 2021-09-06 MED ORDER — ACETAMINOPHEN 10 MG/ML IV SOLN: 1000.0000 mg | Freq: Once | INTRAVENOUS | Status: DC | PRN

## 2021-09-06 MED ORDER — ORAL CARE MOUTH RINSE: 15.0000 mL | Freq: Once | OROMUCOSAL | Status: AC

## 2021-09-06 MED ORDER — MIDAZOLAM HCL 2 MG/2ML IJ SOLN
INTRAMUSCULAR | Status: DC | PRN
  Administered 2021-09-06: 2 mg via INTRAVENOUS

## 2021-09-06 MED ORDER — LACTATED RINGERS IV SOLN: INTRAVENOUS | Status: DC

## 2021-09-06 MED ORDER — ONDANSETRON HCL 4 MG/2ML IJ SOLN
INTRAMUSCULAR | Status: DC | PRN
  Administered 2021-09-06: 4 mg via INTRAVENOUS

## 2021-09-06 MED ORDER — DEXAMETHASONE SODIUM PHOSPHATE 10 MG/ML IJ SOLN
INTRAMUSCULAR | Status: DC | PRN
  Administered 2021-09-06: 10 mg via INTRAVENOUS

## 2021-09-06 NOTE — Transfer of Care (Signed)
Immediate Anesthesia Transfer of Care Note  Patient: Lori Moody  Procedure(s) Performed: MRI LUMBAR SPINE WITHOUT CONTRAST  Patient Location: PACU  Anesthesia Type:General  Level of Consciousness: awake  Airway & Oxygen Therapy: Patient Spontanous Breathing and Patient connected to face mask oxygen  Post-op Assessment: Report given to RN and Post -op Vital signs reviewed and stable  Post vital signs: Reviewed and stable  Last Vitals:  Vitals Value Taken Time  BP 155/92 09/06/21 1231  Temp    Pulse 88 09/06/21 1233  Resp 18 09/06/21 1233  SpO2 91 % 09/06/21 1233  Vitals shown include unvalidated device data.  Last Pain:  Vitals:   09/06/21 0841  TempSrc:   PainSc: 0-No pain         Complications: No notable events documented.

## 2021-09-06 NOTE — Anesthesia Postprocedure Evaluation (Signed)
Anesthesia Post Note  Patient: Lori Moody  Procedure(s) Performed: MRI LUMBAR SPINE WITHOUT CONTRAST     Patient location during evaluation: PACU Anesthesia Type: General Level of consciousness: awake Pain management: pain level controlled Vital Signs Assessment: post-procedure vital signs reviewed and stable Respiratory status: spontaneous breathing, nonlabored ventilation, respiratory function stable and patient connected to nasal cannula oxygen Cardiovascular status: blood pressure returned to baseline and stable Postop Assessment: no apparent nausea or vomiting Anesthetic complications: no   No notable events documented.  Last Vitals:  Vitals:   09/06/21 1300 09/06/21 1305  BP:  (!) 153/83  Pulse: 91 89  Resp:  15  Temp:  36.7 C  SpO2: 90% 95%    Last Pain:  Vitals:   09/06/21 1300  TempSrc:   PainSc: 0-No pain                 Shannyn Jankowiak P [name]

## 2021-09-06 NOTE — Anesthesia Procedure Notes (Signed)
Procedure Name: Intubation Date/Time: 09/06/2021 11:35 AM Performed by: Katina Degree, CRNA Pre-anesthesia Checklist: Patient identified, Emergency Drugs available, Suction available and Patient being monitored Patient Re-evaluated:Patient Re-evaluated prior to induction Oxygen Delivery Method: Circle system utilized Preoxygenation: Pre-oxygenation with 100% oxygen Induction Type: IV induction Ventilation: Mask ventilation without difficulty Laryngoscope Size: Glidescope and 3 Grade View: Grade I Tube type: Oral Tube size: 7.0 mm Number of attempts: 1 Airway Equipment and Method: Stylet and Oral airway Placement Confirmation: ETT inserted through vocal cords under direct vision, positive ETCO2 and breath sounds checked- equal and bilateral Secured at: 21 cm Tube secured with: Tape Dental Injury: Teeth and Oropharynx as per pre-operative assessment

## 2021-09-07 ENCOUNTER — Encounter (HOSPITAL_COMMUNITY): Payer: Self-pay | Admitting: Radiology

## 2021-09-07 ENCOUNTER — Ambulatory Visit: Payer: Self-pay

## 2021-09-07 ENCOUNTER — Telehealth: Payer: Self-pay | Admitting: Family Medicine

## 2021-09-07 ENCOUNTER — Ambulatory Visit (INDEPENDENT_AMBULATORY_CARE_PROVIDER_SITE_OTHER): Payer: Medicaid Other | Admitting: Family Medicine

## 2021-09-07 VITALS — BP 120/62 | Ht 62.0 in | Wt 266.0 lb

## 2021-09-07 DIAGNOSIS — M170 Bilateral primary osteoarthritis of knee: Secondary | ICD-10-CM | POA: Diagnosis present

## 2021-09-07 DIAGNOSIS — M47819 Spondylosis without myelopathy or radiculopathy, site unspecified: Secondary | ICD-10-CM | POA: Diagnosis not present

## 2021-09-07 NOTE — Telephone Encounter (Signed)
Referral faxed to number below.

## 2021-09-07 NOTE — Telephone Encounter (Signed)
Patient called gave address & ph# for [facility]  OP Rehab @ [address] [place]  -ph# [phone].  ---- or will go to [facility] OP Physical Therapy @ [facility] if they do what PT service doctor recommended.  --Forwarding msg to med asst for review.   --FYI

## 2021-09-07 NOTE — Telephone Encounter (Signed)
Patient called back w/ correct ph#  & (Fax#) for [facility] OP rehab scheduling dept  P) [phone] [phone]  --- Please fax referral order to them for  PT.  --glh

## 2021-09-07 NOTE — Assessment & Plan Note (Signed)
Completed series of gel injections today.

## 2021-09-07 NOTE — Assessment & Plan Note (Signed)
MRI was revealing for facet arthropathy but no nerve impingement.  She does have significant lordosis which could be contributing. -Counseled on home exercise therapy and supportive care. -Referral to physical therapy.

## 2021-09-07 NOTE — Progress Notes (Signed)
Lori Moody - 61 y.o. female MRN [id number]  Date of birth: October 26, 1960  SUBJECTIVE:  Including CC & ROS.  No chief complaint on file.   Lori Moody is a 61 y.o. female that is following up after the MRI of her lumbar spine.  This was revealing for facet degenerative changes in the lumbar spine.  There was no nerve impingement.  She is also here for completion of her gel injections.    Review of Systems See HPI   HISTORY: Past Medical, Surgical, Social, and Family History Reviewed & Updated per EMR.   Pertinent Historical Findings include:  Past Medical History:  Diagnosis Date   Anemia    Asthma    allergic   Complication of anesthesia    Elevated IgE level    Hypertension    Iron deficiency    09/02/21- - treating with Iron infusions   PONV (postoperative nausea and vomiting)    Primary generalized (osteo)arthritis    Sleep apnea     Past Surgical History:  Procedure Laterality Date   COLONOSCOPY W/ POLYPECTOMY     ESOPHAGOGASTRODUODENOSCOPY     EYE SURGERY Bilateral    cataracts   RADIOLOGY WITH ANESTHESIA N/A 09/06/2021   Procedure: MRI LUMBAR SPINE WITHOUT CONTRAST;  Surgeon: Radiologist, Medication, MD;  Location: [facility] OR;  Service: Radiology;  Laterality: N/A;   REVERSE SHOULDER ARTHROPLASTY Right    VENTRAL HERNIA REPAIR     x 2     PHYSICAL EXAM:  VS: BP 120/62 (BP Location: Left Arm, Patient Position: Sitting)    Ht 5\' 2"  (1.575 m)    Wt 266 lb (120.7 kg)    BMI 48.65 kg/m  Physical Exam Gen: NAD, alert, cooperative with exam, well-appearing MSK:  Neurovascularly intact    Aspiration/Injection Procedure Note Dewey Neukam 03/20/1961   Procedure: Injection Indications: left knee pain    Procedure Details Consent: Risks of procedure as well as the alternatives and risks of each were explained to the (patient/caregiver).  Consent for procedure obtained. Time Out: Verified patient identification, verified procedure, site/side was marked, verified  correct patient position, special equipment/implants available, medications/allergies/relevent history reviewed, required imaging and test results available.  Performed.  The area was cleaned with iodine and alcohol swabs.     The left knee superior lateral suprapatellar pouch was injected using 4 cc's of 1% lidocaine with a 21 2" needle.  The syringe was switched and a 60mL of synvisc was injected. Ultrasound was used. Images were obtained in  Long views showing the injection.     A sterile dressing was applied.   Patient did tolerate procedure well.   Aspiration/Injection Procedure Note Yennifer Segovia 03-05-1961   Procedure: Injection Indications: right knee pain    Procedure Details Consent: Risks of procedure as well as the alternatives and risks of each were explained to the (patient/caregiver).  Consent for procedure obtained. Time Out: Verified patient identification, verified procedure, site/side was marked, verified correct patient position, special equipment/implants available, medications/allergies/relevent history reviewed, required imaging and test results available.  Performed.  The area was cleaned with iodine and alcohol swabs.     The right knee superior lateral suprapatellar pouch was injected using 4 cc's of 1% lidocaine with a 21 2" needle.  The syringe was switched and a 79mL of synvisc was injected. Ultrasound was used. Images were obtained in  Long views showing the injection.     A sterile dressing was applied.   Patient did tolerate procedure well.  ASSESSMENT & PLAN:   Facet arthropathy MRI was revealing for facet arthropathy but no nerve impingement.  She does have significant lordosis which could be contributing. -Counseled on home exercise therapy and supportive care. -Referral to physical therapy.  Primary osteoarthritis of both knees Completed series of gel injections today.

## 2021-09-07 NOTE — Patient Instructions (Signed)
Good to see you  Please use ice as needed  Please send me a message in MyChart with any questions or updates.  Please see me back in 4 weeks.   --Dr. Krysti Hickling  

## 2021-09-08 NOTE — H&P (Signed)
@  LOGO@ Burdette Gergely - 61 y.o. female MRN [id number]  Date of birth: 1960/07/30  SUBJECTIVE:  Including CC & ROS.  No chief complaint on file.   Erendida Wrenn is a 61 y.o. female that is presenting for lumbar MRI.  Has radicular symptoms down the leg.  Symptoms are acute on chronic in nature.    Review of Systems See HPI   HISTORY: Past Medical, Surgical, Social, and Family History Reviewed & Updated per EMR.   Pertinent Historical Findings include:  Past Medical History:  Diagnosis Date   Anemia    Asthma    allergic   Complication of anesthesia    Elevated IgE level    Hypertension    Iron deficiency    09/02/21- - treating with Iron infusions   PONV (postoperative nausea and vomiting)    Primary generalized (osteo)arthritis    Sleep apnea     Past Surgical History:  Procedure Laterality Date   COLONOSCOPY W/ POLYPECTOMY     ESOPHAGOGASTRODUODENOSCOPY     EYE SURGERY Bilateral    cataracts   RADIOLOGY WITH ANESTHESIA N/A 09/06/2021   Procedure: MRI LUMBAR SPINE WITHOUT CONTRAST;  Surgeon: Radiologist, Medication, MD;  Location: [facility] OR;  Service: Radiology;  Laterality: N/A;   REVERSE SHOULDER ARTHROPLASTY Right    VENTRAL HERNIA REPAIR     x 2     PHYSICAL EXAM:  VS: BP (!) 153/83 (BP Location: Left Arm)    Pulse 89    Temp 98 F (36.7 C)    Resp 15    Ht 5\' 2"  (1.575 m)    Wt 120.7 kg    SpO2 95%    BMI 48.65 kg/m  Physical Exam Gen: NAD, alert, cooperative with exam, well-appearing Neurovascular intact   ASSESSMENT & PLAN:   Acute on chronic lumbar radiculopathy down the left leg.  Here for MRI of the lumbar spine today.

## 2021-09-13 ENCOUNTER — Ambulatory Visit: Payer: Medicaid Other | Admitting: Family Medicine

## 2021-09-26 ENCOUNTER — Telehealth: Payer: Self-pay | Admitting: Family Medicine

## 2021-09-26 DIAGNOSIS — M170 Bilateral primary osteoarthritis of knee: Secondary | ICD-10-CM

## 2021-09-26 NOTE — Telephone Encounter (Signed)
Pt cld states her pharmacy told her Pennsaid Rx req'd a Pre-auth --Per pt she never got the Rx but wants it, request provider initiate the Auth for:  ? ? ? ?Diclofenac Sodium (PENNSAID) 2 % SOLN [[id number]]  ?  Order Details ?Dose: 1 application. Route: Transdermal Frequency: 2 times daily  ?Dispense Quantity: 112 g Refills: 2   ?     ?Sig: Place 1 application onto the skin 2 (two) times daily.  ?Patient taking differently: Place 1 application onto the skin 2 (two) times daily as needed (pain).  ?     ? ? ?Pharmacy : ? ?[facility] [place] - [address]  ?[address] [place] [address]  ?Phone:  [phone]  Fax:  [phone]  ? ?-glh ?

## 2021-09-26 NOTE — Telephone Encounter (Signed)
Pennsaid 2% gel  PA initiated via CoverMyMeds.  ?Key: [id number] ?

## 2021-09-27 MED ORDER — DICLOFENAC SODIUM 1 % EX GEL: 2.0000 g | Freq: Four times a day (QID) | CUTANEOUS | 2 refills | Status: DC

## 2021-09-27 NOTE — Telephone Encounter (Signed)
Received fax from [employer]. Pennsaid 2% PA is denied. Patient must use Voltaren (diclofenac topical gel). Patient informed of denial. She is requesting rx for Voltaren. See meds.  ?

## 2021-10-06 ENCOUNTER — Ambulatory Visit: Payer: Self-pay

## 2021-10-06 ENCOUNTER — Ambulatory Visit (INDEPENDENT_AMBULATORY_CARE_PROVIDER_SITE_OTHER): Payer: Medicaid Other | Admitting: Family Medicine

## 2021-10-06 ENCOUNTER — Encounter: Payer: Self-pay | Admitting: Family Medicine

## 2021-10-06 VITALS — BP 148/80 | Ht 62.0 in | Wt 266.0 lb

## 2021-10-06 DIAGNOSIS — M25572 Pain in left ankle and joints of left foot: Secondary | ICD-10-CM | POA: Diagnosis not present

## 2021-10-06 DIAGNOSIS — M7672 Peroneal tendinitis, left leg: Secondary | ICD-10-CM | POA: Diagnosis not present

## 2021-10-06 DIAGNOSIS — M2142 Flat foot [pes planus] (acquired), left foot: Secondary | ICD-10-CM | POA: Insufficient documentation

## 2021-10-06 DIAGNOSIS — M84375A Stress fracture, left foot, initial encounter for fracture: Secondary | ICD-10-CM | POA: Diagnosis present

## 2021-10-06 NOTE — Assessment & Plan Note (Signed)
Acutely occurring.  Having effusion around the peroneal tendon ?-Counseled on home exercise therapy and supportive care. ?-Cam walker. ?-Could consider injection. ? ?

## 2021-10-06 NOTE — Assessment & Plan Note (Signed)
Acutely occurring.  Having midfoot changes on the lateral aspect.  Arthritis in this area that does show irritation. ?-Counseled on home exercise therapy and supportive care. ?-We will try an insole or custom orthotic to help alleviate this area. ?-Could consider injection. ?

## 2021-10-06 NOTE — Assessment & Plan Note (Signed)
Acutely occurring.  Has pes planus that would suggest that she is having this instability on the lateral portion of her foot causing the current symptoms. ?-Counseled on home exercise therapy and supportive care. ?-Cam walker. ?-Could consider physical therapy or further imaging. ?

## 2021-10-06 NOTE — Progress Notes (Signed)
?  Lori Moody - 61 y.o. female MRN [id number]  Date of birth: Apr 30, 1961 ? ?SUBJECTIVE:  Including CC & ROS.  ?No chief complaint on file. ? ? ?Lori Moody is a 61 y.o. female that is presenting with acute on chronic left ankle pain.  She is having left-sided lateral hindfoot ankle pain.  It is severe in nature.  Has gotten worse over the past few weeks.  Having pain with transition from sitting to standing.  Had an ankle injury last May.  No improvement with Celebrex and medications. ? ?Reviewed the note from 12/02/2020 shows she was counseled on over-the-counter medications. ?Independent review of the left ankle x-ray from 12/02/2020 shows mild degenerative changes of the ankle joint and midfoot. ? ? ?Review of Systems ?See HPI  ? ?HISTORY: Past Medical, Surgical, Social, and Family History Reviewed & Updated per EMR.   ?Pertinent Historical Findings include: ? ?Past Medical History:  ?Diagnosis Date  ? Anemia   ? Asthma   ? allergic  ? Complication of anesthesia   ? Elevated IgE level   ? Hypertension   ? Iron deficiency   ? 09/02/21- - treating with Iron infusions  ? PONV (postoperative nausea and vomiting)   ? Primary generalized (osteo)arthritis   ? Sleep apnea   ? ? ?Past Surgical History:  ?Procedure Laterality Date  ? COLONOSCOPY W/ POLYPECTOMY    ? ESOPHAGOGASTRODUODENOSCOPY    ? EYE SURGERY Bilateral   ? cataracts  ? RADIOLOGY WITH ANESTHESIA N/A 09/06/2021  ? Procedure: MRI LUMBAR SPINE WITHOUT CONTRAST;  Surgeon: Radiologist, Medication, MD;  Location: [facility] OR;  Service: Radiology;  Laterality: N/A;  ? REVERSE SHOULDER ARTHROPLASTY Right   ? VENTRAL HERNIA REPAIR    ? x 2  ? ? ? ?PHYSICAL EXAM:  ?VS: BP (!) 148/80 (BP Location: Left Arm, Patient Position: Sitting)   Ht 5\' 2"  (1.575 m)   Wt 266 lb (120.7 kg)   BMI 48.65 kg/m?  ?Physical Exam ?Gen: NAD, alert, cooperative with exam, well-appearing ?MSK: ?Neurovascularly intact   ? ?Limited ultrasound: Left foot and ankle: ? ?No ankle joint  effusion. ?Effusion noted around the peroneal tendons at the lateral malleolus. ?Significant degenerative changes between the talus and cuboid. ?Increased hyperemia over the lateral aspect of the cuboid to suggest a stress reaction ? ?Summary: Midfoot arthritis, peroneal tendinitis and stress reaction of the cuboid ? ?Ultrasound and interpretation by , MD ? ? ? ?ASSESSMENT & PLAN:  ? ?Stress reaction of left foot ?Acutely occurring.  Has pes planus that would suggest that she is having this instability on the lateral portion of her foot causing the current symptoms. ?-Counseled on home exercise therapy and supportive care. ?-Cam walker. ?-Could consider physical therapy or further imaging. ? ?Peroneal tendinitis of left lower leg ?Acutely occurring.  Having effusion around the peroneal tendon ?-Counseled on home exercise therapy and supportive care. ?-Cam walker. ?-Could consider injection. ? ? ?Sinus tarsi syndrome of left foot ?Acutely occurring.  Having midfoot changes on the lateral aspect.  Arthritis in this area that does show irritation. ?-Counseled on home exercise therapy and supportive care. ?-We will try an insole or custom orthotic to help alleviate this area. ?-Could consider injection. ? ? ? ? ?

## 2021-10-06 NOTE — Patient Instructions (Signed)
Good to see you ?Please try ice as needed  ?Please use the boot  ?Please avoid walking barefoot   ?Please send me a message in MyChart with any questions or updates.  ?Please see me back in 3 weeks.  ? ?--Dr. Jordan Likes ? ?

## 2021-10-31 ENCOUNTER — Ambulatory Visit: Payer: Medicaid Other | Admitting: Family Medicine

## 2021-11-18 ENCOUNTER — Ambulatory Visit (INDEPENDENT_AMBULATORY_CARE_PROVIDER_SITE_OTHER): Payer: Medicaid Other | Admitting: Family Medicine

## 2021-11-18 ENCOUNTER — Ambulatory Visit: Payer: Self-pay

## 2021-11-18 ENCOUNTER — Encounter: Payer: Self-pay | Admitting: Family Medicine

## 2021-11-18 ENCOUNTER — Ambulatory Visit (HOSPITAL_BASED_OUTPATIENT_CLINIC_OR_DEPARTMENT_OTHER)
Admission: RE | Admit: 2021-11-18 | Discharge: 2021-11-18 | Disposition: A | Discharge status: Home / Self Care | Payer: Medicaid Other | Source: Ambulatory Visit | Attending: Family Medicine | Admitting: Family Medicine

## 2021-11-18 VITALS — BP 118/80 | Ht 62.0 in | Wt 266.0 lb

## 2021-11-18 DIAGNOSIS — M25572 Pain in left ankle and joints of left foot: Secondary | ICD-10-CM

## 2021-11-18 DIAGNOSIS — M84375D Stress fracture, left foot, subsequent encounter for fracture with routine healing: Secondary | ICD-10-CM | POA: Diagnosis present

## 2021-11-18 NOTE — Progress Notes (Signed)
?  Deborra Phegley - 61 y.o. female MRN [id number]  Date of birth: 27-Oct-1960 ? ?SUBJECTIVE:  Including CC & ROS.  ?No chief complaint on file. ? ? ?Tamiah Dysart is a 61 y.o. female that is presenting with ongoing left foot pain.  She is having pain over the lateral midfoot.  Does get improvement with the cam walker but feels pain with weightbearing. ? ? ?Review of Systems ?See HPI  ? ?HISTORY: Past Medical, Surgical, Social, and Family History Reviewed & Updated per EMR.   ?Pertinent Historical Findings include: ? ?Past Medical History:  ?Diagnosis Date  ? Anemia   ? Asthma   ? allergic  ? Complication of anesthesia   ? Elevated IgE level   ? Hypertension   ? Iron deficiency   ? 09/02/21- - treating with Iron infusions  ? PONV (postoperative nausea and vomiting)   ? Primary generalized (osteo)arthritis   ? Sleep apnea   ? ? ?Past Surgical History:  ?Procedure Laterality Date  ? COLONOSCOPY W/ POLYPECTOMY    ? ESOPHAGOGASTRODUODENOSCOPY    ? EYE SURGERY Bilateral   ? cataracts  ? RADIOLOGY WITH ANESTHESIA N/A 09/06/2021  ? Procedure: MRI LUMBAR SPINE WITHOUT CONTRAST;  Surgeon: Radiologist, Medication, MD;  Location: [facility];  Service: Radiology;  Laterality: N/A;  ? REVERSE SHOULDER ARTHROPLASTY Right   ? VENTRAL HERNIA REPAIR    ? x 2  ? ? ? ?PHYSICAL EXAM:  ?VS: BP 118/80 (BP Location: Left Arm, Patient Position: Sitting)   Ht 5\' 2"  (1.575 m)   Wt 266 lb (120.7 kg)   BMI 48.65 kg/m?  ?Physical Exam ?Gen: NAD, alert, cooperative with exam, well-appearing ?MSK:  ?Neurovascularly intact   ? ?Limited ultrasound: Left foot: ? ?Findings today continue to demonstrate that the cuboid has a stress change.  There is hyperemia over the cuboid bone. ?There is mild effusion within the peroneal tendons. ? ?Summary: Findings most consistent with stress change of the cuboid. ? ?Ultrasound and interpretation by , MD ? ? ? ?ASSESSMENT & PLAN:  ? ?Sinus tarsi syndrome of left foot ?Does have fluid at the distal fibula and  the peroneal tendon but no pain with resistance to inversion or significant palpation of this area. ?-Counseled on home exercise therapy and supportive care. ? ?Stress reaction of left foot ?Acutely occurring.  Symptoms seem more consistent with a stress change of the cuboid based on the imaging today. ?-Counseled on home exercise therapy and supportive care. ?-X-ray. ?-Counseled on CAM Walker. ?-Could consider further imaging. ? ? ? ? ?

## 2021-11-18 NOTE — Assessment & Plan Note (Signed)
Does have fluid at the distal fibula and the peroneal tendon but no pain with resistance to inversion or significant palpation of this area. ?-Counseled on home exercise therapy and supportive care. ?

## 2021-11-18 NOTE — Assessment & Plan Note (Signed)
Acutely occurring.  Symptoms seem more consistent with a stress change of the cuboid based on the imaging today. ?-Counseled on home exercise therapy and supportive care. ?-X-ray. ?-Counseled on CAM Walker. ?-Could consider further imaging. ?

## 2021-11-18 NOTE — Patient Instructions (Signed)
Good to see you  ?Please use the boot to help with walking  ?I will call with the xray results.  ?Please send me a message in MyChart with any questions or updates.  ?I'll check which imaging we can try next.  ? ?--Dr. Jordan Likes ? ?

## 2021-11-24 ENCOUNTER — Telehealth: Payer: Self-pay | Admitting: Family Medicine

## 2021-11-24 DIAGNOSIS — M84375G Stress fracture, left foot, subsequent encounter for fracture with delayed healing: Secondary | ICD-10-CM

## 2021-11-24 NOTE — Telephone Encounter (Signed)
Informed on results.  We will pursue MRI of the left ankle to evaluate for stress fracture of the cuboid with delayed healing.  X-ray has been negative thus far.  We have tried conservative measure with immobilization for 6 weeks. ? ?Myra Rude, MD ?[facility] ?11/24/2021, 12:55 PM ? ?

## 2021-12-10 ENCOUNTER — Other Ambulatory Visit: Payer: Medicaid Other

## 2022-01-07 ENCOUNTER — Other Ambulatory Visit: Payer: Medicaid Other

## 2022-01-14 ENCOUNTER — Ambulatory Visit (INDEPENDENT_AMBULATORY_CARE_PROVIDER_SITE_OTHER): Payer: Medicaid Other

## 2022-01-14 DIAGNOSIS — M84375G Stress fracture, left foot, subsequent encounter for fracture with delayed healing: Secondary | ICD-10-CM

## 2022-01-14 DIAGNOSIS — M25572 Pain in left ankle and joints of left foot: Secondary | ICD-10-CM | POA: Diagnosis not present

## 2022-01-14 DIAGNOSIS — G8929 Other chronic pain: Secondary | ICD-10-CM

## 2022-01-16 ENCOUNTER — Encounter: Payer: Self-pay | Admitting: Family Medicine

## 2022-01-16 ENCOUNTER — Telehealth (INDEPENDENT_AMBULATORY_CARE_PROVIDER_SITE_OTHER): Payer: Medicaid Other | Admitting: Family Medicine

## 2022-01-16 DIAGNOSIS — M2142 Flat foot [pes planus] (acquired), left foot: Secondary | ICD-10-CM

## 2022-01-19 ENCOUNTER — Encounter: Payer: Medicaid Other | Admitting: Family Medicine

## 2022-01-30 ENCOUNTER — Telehealth: Payer: Self-pay | Admitting: *Deleted

## 2022-01-30 NOTE — Telephone Encounter (Signed)
I called [employer] provider services to obtain authorization for her custom orthotic  (L3030 x 2)  appointment on 01/31/22 @ 2:30. I called [employer] @ [phone] x 3. All 3 times, while being transferred by the automatic system, the calls were dropped. I was unable to speak to a live representative. I called pt and left her a detailed message informing her of this.    She has the option to keep her ov on 01/31/22, call her insurance herself or reschedule to a later date.

## 2022-01-31 ENCOUNTER — Encounter: Payer: Medicaid Other | Admitting: Family Medicine

## 2022-02-06 ENCOUNTER — Ambulatory Visit (INDEPENDENT_AMBULATORY_CARE_PROVIDER_SITE_OTHER): Payer: Medicaid Other | Admitting: Family Medicine

## 2022-02-06 DIAGNOSIS — M2142 Flat foot [pes planus] (acquired), left foot: Secondary | ICD-10-CM

## 2022-02-06 NOTE — Progress Notes (Signed)
  Lori Moody - 61 y.o. female MRN [id number]  Date of birth: September 27, 1960  SUBJECTIVE:  Including CC & ROS.  No chief complaint on file.   Lori Moody is a 61 y.o. female that is  here for orthotics.   Review of Systems See HPI   HISTORY: Past Medical, Surgical, Social, and Family History Reviewed & Updated per EMR.   Pertinent Historical Findings include:  Past Medical History:  Diagnosis Date   Anemia    Asthma    allergic   Complication of anesthesia    Elevated IgE level    Hypertension    Iron deficiency    09/02/21- - treating with Iron infusions   PONV (postoperative nausea and vomiting)    Primary generalized (osteo)arthritis    Sleep apnea     Past Surgical History:  Procedure Laterality Date   COLONOSCOPY W/ POLYPECTOMY     ESOPHAGOGASTRODUODENOSCOPY     EYE SURGERY Bilateral    cataracts   RADIOLOGY WITH ANESTHESIA N/A 09/06/2021   Procedure: MRI LUMBAR SPINE WITHOUT CONTRAST;  Surgeon: Radiologist, Medication, MD;  Location: [facility] OR;  Service: Radiology;  Laterality: N/A;   REVERSE SHOULDER ARTHROPLASTY Right    VENTRAL HERNIA REPAIR     x 2     PHYSICAL EXAM:  VS: BP 140/80   Ht 5' 1.5" (1.562 m)   Wt 263 lb (119.3 kg)   BMI 48.89 kg/m  Physical Exam Gen: NAD, alert, cooperative with exam, well-appearing MSK:  Neurovascularly intact    Patient was fitted for a standard, cushioned, semi-rigid orthotic. The orthotic was heated and afterward the patient stood on the orthotic blank positioned on the orthotic stand. The patient was positioned in subtalar neutral position and 10 degrees of ankle dorsiflexion in a weight bearing stance. After completion of molding, a stable base was applied to the orthotic blank. The blank was ground to a stable position for weight bearing. Size: 7 Pairs: 2 Base: Blue EVA Additional Posting and Padding: left sided heel lift The patient ambulated these, and they were very comfortable.    ASSESSMENT & PLAN:    Pes planus of left foot Completed orthotics with left heel lift

## 2022-02-06 NOTE — Assessment & Plan Note (Signed)
Completed orthotics with left heel lift

## 2022-02-21 ENCOUNTER — Telehealth: Payer: Self-pay | Admitting: Family Medicine

## 2022-02-21 NOTE — Telephone Encounter (Signed)
Submitted prior auth request to [employer] today.

## 2022-03-02 NOTE — Telephone Encounter (Signed)
Request is complete and [employer] is eligible at 100%. Prior auth not required. Sent to Genella Rife and Lanier Prude to schedule.

## 2022-03-09 NOTE — Telephone Encounter (Signed)
[employer] received. Patient has OV 03/10/22.

## 2022-03-10 ENCOUNTER — Ambulatory Visit: Payer: Medicaid Other | Admitting: Family Medicine

## 2022-03-14 ENCOUNTER — Encounter: Payer: Self-pay | Admitting: Family Medicine

## 2022-03-14 ENCOUNTER — Ambulatory Visit: Payer: Self-pay

## 2022-03-14 ENCOUNTER — Ambulatory Visit (INDEPENDENT_AMBULATORY_CARE_PROVIDER_SITE_OTHER): Payer: Medicaid Other | Admitting: Family Medicine

## 2022-03-14 VITALS — BP 138/72 | Ht 61.5 in | Wt 263.0 lb

## 2022-03-14 DIAGNOSIS — M170 Bilateral primary osteoarthritis of knee: Secondary | ICD-10-CM

## 2022-03-14 MED ORDER — HYLAN G-F 20 16 MG/2ML IX SOSY
16.0000 mg | PREFILLED_SYRINGE | Freq: Once | INTRA_ARTICULAR | Status: AC
  Administered 2022-03-14: 16 mg via INTRA_ARTICULAR

## 2022-03-14 NOTE — Patient Instructions (Signed)
Good to see you Please use ice as needed  Please send me a message in MyChart with any questions or updates.  Please see me back in 1 week.   --Dr. Katriana Dortch  

## 2022-03-14 NOTE — Assessment & Plan Note (Signed)
Completed bilateral gel injections. 1/3.  - could consider zilretta

## 2022-03-14 NOTE — Progress Notes (Signed)
  Lori Moody - 61 y.o. female MRN [id number]  Date of birth: 09-Sep-1960  SUBJECTIVE:  Including CC & ROS.  No chief complaint on file.   Lori Moody is a 61 y.o. female that is here for her gel injections.   Review of Systems See HPI   HISTORY: Past Medical, Surgical, Social, and Family History Reviewed & Updated per EMR.   Pertinent Historical Findings include:  Past Medical History:  Diagnosis Date   Anemia    Asthma    allergic   Complication of anesthesia    Elevated IgE level    Hypertension    Iron deficiency    09/02/21- - treating with Iron infusions   PONV (postoperative nausea and vomiting)    Primary generalized (osteo)arthritis    Sleep apnea     Past Surgical History:  Procedure Laterality Date   COLONOSCOPY W/ POLYPECTOMY     ESOPHAGOGASTRODUODENOSCOPY     EYE SURGERY Bilateral    cataracts   RADIOLOGY WITH ANESTHESIA N/A 09/06/2021   Procedure: MRI LUMBAR SPINE WITHOUT CONTRAST;  Surgeon: Radiologist, Medication, MD;  Location: [facility] OR;  Service: Radiology;  Laterality: N/A;   REVERSE SHOULDER ARTHROPLASTY Right    VENTRAL HERNIA REPAIR     x 2     PHYSICAL EXAM:  VS: BP 138/72 (BP Location: Left Arm, Patient Position: Sitting)   Ht 5' 1.5" (1.562 m)   Wt 263 lb (119.3 kg)   BMI 48.89 kg/m  Physical Exam Gen: NAD, alert, cooperative with exam, well-appearing MSK:  Neurovascularly intact     Aspiration/Injection Procedure Note Lori Moody [birth year]  Procedure: Injection Indications: left knee pain  Procedure Details Consent: Risks of procedure as well as the alternatives and risks of each were explained to the (patient/caregiver).  Consent for procedure obtained. Time Out: Verified patient identification, verified procedure, site/side was marked, verified correct patient position, special equipment/implants available, medications/allergies/relevent history reviewed, required imaging and test results available.  Performed.  The area was  cleaned with iodine and alcohol swabs.    The left knee superior lateral suprapatellar pouch was injected using 4 cc's of 1% lidocaine with a 21  1/2" needle.  The syringe was switched  76mL of synvisc was injected. Ultrasound was used. Images were obtained in  Long views showing the injection.    A sterile dressing was applied.  Patient did tolerate procedure well.  Aspiration/Injection Procedure Note Lori Moody 27-Aug-1960  Procedure: Injection Indications: right knee pain  Procedure Details Consent: Risks of procedure as well as the alternatives and risks of each were explained to the (patient/caregiver).  Consent for procedure obtained. Time Out: Verified patient identification, verified procedure, site/side was marked, verified correct patient position, special equipment/implants available, medications/allergies/relevent history reviewed, required imaging and test results available.  Performed.  The area was cleaned with iodine and alcohol swabs.    The right knee superior lateral suprapatellar pouch was injected using 4 cc's of 1% lidocaine with a 21  1/2" needle.  The syringe was switched  72mL of synvisc was injected. Ultrasound was used. Images were obtained in  Long views showing the injection.    A sterile dressing was applied.  Patient did tolerate procedure well.   ASSESSMENT & PLAN:   Primary osteoarthritis of both knees Completed bilateral gel injections. 1/3.  - could consider zilretta

## 2022-03-21 ENCOUNTER — Ambulatory Visit (INDEPENDENT_AMBULATORY_CARE_PROVIDER_SITE_OTHER): Payer: Medicaid Other | Admitting: Family Medicine

## 2022-03-21 ENCOUNTER — Ambulatory Visit: Payer: Self-pay

## 2022-03-21 VITALS — BP 160/71 | Ht 61.5 in | Wt 263.0 lb

## 2022-03-21 DIAGNOSIS — M170 Bilateral primary osteoarthritis of knee: Secondary | ICD-10-CM | POA: Diagnosis present

## 2022-03-21 MED ORDER — HYLAN G-F 20 16 MG/2ML IX SOSY
16.0000 mg | PREFILLED_SYRINGE | Freq: Once | INTRA_ARTICULAR | Status: AC
  Administered 2022-03-21: 16 mg via INTRA_ARTICULAR

## 2022-03-21 NOTE — Progress Notes (Signed)
  Lori Moody - 61 y.o. female MRN [id number]  Date of birth: [birth year]  SUBJECTIVE:  Including CC & ROS.  No chief complaint on file.   Lori Moody is a 61 y.o. female that is  here for gel injections.    Review of Systems See HPI   HISTORY: Past Medical, Surgical, Social, and Family History Reviewed & Updated per EMR.   Pertinent Historical Findings include:  Past Medical History:  Diagnosis Date   Anemia    Asthma    allergic   Complication of anesthesia    Elevated IgE level    Hypertension    Iron deficiency    09/02/21- - treating with Iron infusions   PONV (postoperative nausea and vomiting)    Primary generalized (osteo)arthritis    Sleep apnea     Past Surgical History:  Procedure Laterality Date   COLONOSCOPY W/ POLYPECTOMY     ESOPHAGOGASTRODUODENOSCOPY     EYE SURGERY Bilateral    cataracts   RADIOLOGY WITH ANESTHESIA N/A 09/06/2021   Procedure: MRI LUMBAR SPINE WITHOUT CONTRAST;  Surgeon: Radiologist, Medication, MD;  Location: [facility] OR;  Service: Radiology;  Laterality: N/A;   REVERSE SHOULDER ARTHROPLASTY Right    VENTRAL HERNIA REPAIR     x 2     PHYSICAL EXAM:  VS: BP (!) 160/71 (BP Location: Left Arm, Patient Position: Sitting)   Ht 5' 1.5" (1.562 m)   Wt 263 lb (119.3 kg)   BMI 48.89 kg/m  Physical Exam Gen: NAD, alert, cooperative with exam, well-appearing MSK:  Neurovascularly intact     Aspiration/Injection Procedure Note Lori Moody 1960/08/04  Procedure: Injection Indications: right knee pain  Procedure Details Consent: Risks of procedure as well as the alternatives and risks of each were explained to the (patient/caregiver).  Consent for procedure obtained. Time Out: Verified patient identification, verified procedure, site/side was marked, verified correct patient position, special equipment/implants available, medications/allergies/relevent history reviewed, required imaging and test results available.  Performed.  The area  was cleaned with iodine and alcohol swabs.    The right knee superior lateral suprapatellar pouch was injected using 4 cc's of 1% lidocaine with a 21 2" needle.  The syringe was switched and 2 mL of synvisc was injected. Ultrasound was used. Images were obtained in  Long views showing the injection.    A sterile dressing was applied.  Patient did tolerate procedure well.  Aspiration/Injection Procedure Note Lori Moody [birth year]  Procedure: Injection Indications: left knee pain  Procedure Details Consent: Risks of procedure as well as the alternatives and risks of each were explained to the (patient/caregiver).  Consent for procedure obtained. Time Out: Verified patient identification, verified procedure, site/side was marked, verified correct patient position, special equipment/implants available, medications/allergies/relevent history reviewed, required imaging and test results available.  Performed.  The area was cleaned with iodine and alcohol swabs.    The left knee superior lateral suprapatellar pouch was injected using 4 cc's of 1% lidocaine with a 21 2" needle.  The syringe was switched and 2 mL of synvisc was injected. Ultrasound was used. Images were obtained in  Long views showing the injection.    A sterile dressing was applied.  Patient did tolerate procedure well.  ASSESSMENT & PLAN:   Primary osteoarthritis of both knees Completed gel injection 2/3

## 2022-03-21 NOTE — Patient Instructions (Signed)
Good to see you  Please send me a message in MyChart with any questions or updates.  Please see me back in 1 week.   --Dr. Raffaele Derise  

## 2022-03-21 NOTE — Assessment & Plan Note (Signed)
Completed gel injection 2/3

## 2022-03-30 ENCOUNTER — Encounter: Payer: Self-pay | Admitting: Family Medicine

## 2022-03-30 ENCOUNTER — Ambulatory Visit (INDEPENDENT_AMBULATORY_CARE_PROVIDER_SITE_OTHER): Payer: Medicaid Other | Admitting: Family Medicine

## 2022-03-30 ENCOUNTER — Ambulatory Visit: Payer: Self-pay

## 2022-03-30 VITALS — BP 138/72 | Ht 61.5 in | Wt 263.0 lb

## 2022-03-30 DIAGNOSIS — M170 Bilateral primary osteoarthritis of knee: Secondary | ICD-10-CM

## 2022-03-30 MED ORDER — HYLAN G-F 20 16 MG/2ML IX SOSY
16.0000 mg | PREFILLED_SYRINGE | Freq: Once | INTRA_ARTICULAR | Status: AC
  Administered 2022-03-30: 16 mg via INTRA_ARTICULAR

## 2022-03-30 NOTE — Patient Instructions (Signed)
Good to see you  Please send me a message in MyChart with any questions or updates.  Please see me back in 1 week.   --Dr. Markian Glockner  

## 2022-03-30 NOTE — Assessment & Plan Note (Signed)
Completed gel injection 3/3

## 2022-03-30 NOTE — Progress Notes (Signed)
  Lori Moody - 61 y.o. female MRN [id number]  Date of birth: 10/10/1960  SUBJECTIVE:  Including CC & ROS.  No chief complaint on file.   Lori Moody is a 61 y.o. female that is  here for gel injections.    Review of Systems See HPI   HISTORY: Past Medical, Surgical, Social, and Family History Reviewed & Updated per EMR.   Pertinent Historical Findings include:  Past Medical History:  Diagnosis Date   Anemia    Asthma    allergic   Complication of anesthesia    Elevated IgE level    Hypertension    Iron deficiency    09/02/21- - treating with Iron infusions   PONV (postoperative nausea and vomiting)    Primary generalized (osteo)arthritis    Sleep apnea     Past Surgical History:  Procedure Laterality Date   COLONOSCOPY W/ POLYPECTOMY     ESOPHAGOGASTRODUODENOSCOPY     EYE SURGERY Bilateral    cataracts   RADIOLOGY WITH ANESTHESIA N/A 09/06/2021   Procedure: MRI LUMBAR SPINE WITHOUT CONTRAST;  Surgeon: Radiologist, Medication, MD;  Location: [facility] OR;  Service: Radiology;  Laterality: N/A;   REVERSE SHOULDER ARTHROPLASTY Right    VENTRAL HERNIA REPAIR     x 2     PHYSICAL EXAM:  VS: BP 138/72 (BP Location: Left Arm, Patient Position: Sitting)   Ht 5' 1.5" (1.562 m)   Wt 263 lb (119.3 kg)   BMI 48.89 kg/m  Physical Exam Gen: NAD, alert, cooperative with exam, well-appearing MSK:  Neurovascularly intact    Aspiration/Injection Procedure Note Lori Moody [birth year]   Procedure: Injection Indications: right and left knee pain   Procedure Details Consent: Risks of procedure as well as the alternatives and risks of each were explained to the (patient/caregiver).  Consent for procedure obtained. Time Out: Verified patient identification, verified procedure, site/side was marked, verified correct patient position, special equipment/implants available, medications/allergies/relevent history reviewed, required imaging and test results available.  Performed.  The  area was cleaned with iodine and alcohol swabs.     The right and left knee superior lateral suprapatellar pouch was injected using 4 cc's of 1% lidocaine with a 21 2" needle.  The syringe was switched and 2 mL of synvisc was injected. Ultrasound was used. Images were obtained in  Long views showing the injection.     A sterile dressing was applied.   Patient did tolerate procedure well.   ASSESSMENT & PLAN:   Primary osteoarthritis of both knees Completed gel injection 3/3

## 2022-05-01 ENCOUNTER — Other Ambulatory Visit: Payer: Self-pay | Admitting: Family Medicine

## 2022-06-13 ENCOUNTER — Telehealth: Payer: Self-pay | Admitting: *Deleted

## 2022-06-13 DIAGNOSIS — M2142 Flat foot [pes planus] (acquired), left foot: Secondary | ICD-10-CM

## 2022-06-13 NOTE — Telephone Encounter (Signed)
-----   Message from Carl Best sent at 06/13/2022  8:14 AM EST ----- Regarding: Pt request a Order for PT to [facility] Pt called ask for another Referral order to be sent to [facility] for addt'l physical therapy.  Left foot pain    [id number]  [address]  [place], [place] [address]  Phone: [phone]  Fax: [phone]

## 2022-06-13 NOTE — Telephone Encounter (Signed)
Referral placed and faxed to below.

## 2022-08-01 ENCOUNTER — Other Ambulatory Visit: Payer: Self-pay | Admitting: Family Medicine

## 2022-11-06 ENCOUNTER — Encounter: Payer: Self-pay | Admitting: *Deleted

## 2022-11-27 IMAGING — DX DG FOOT COMPLETE 3+V*L*
3 series · 3 of 3 positions shown · non-contrast
Comparison: None.

CLINICAL DATA: Left foot pain after injury last year.
Weight-bearing views.

EXAM:
LEFT FOOT - COMPLETE 3+ VIEW

[foot ap]
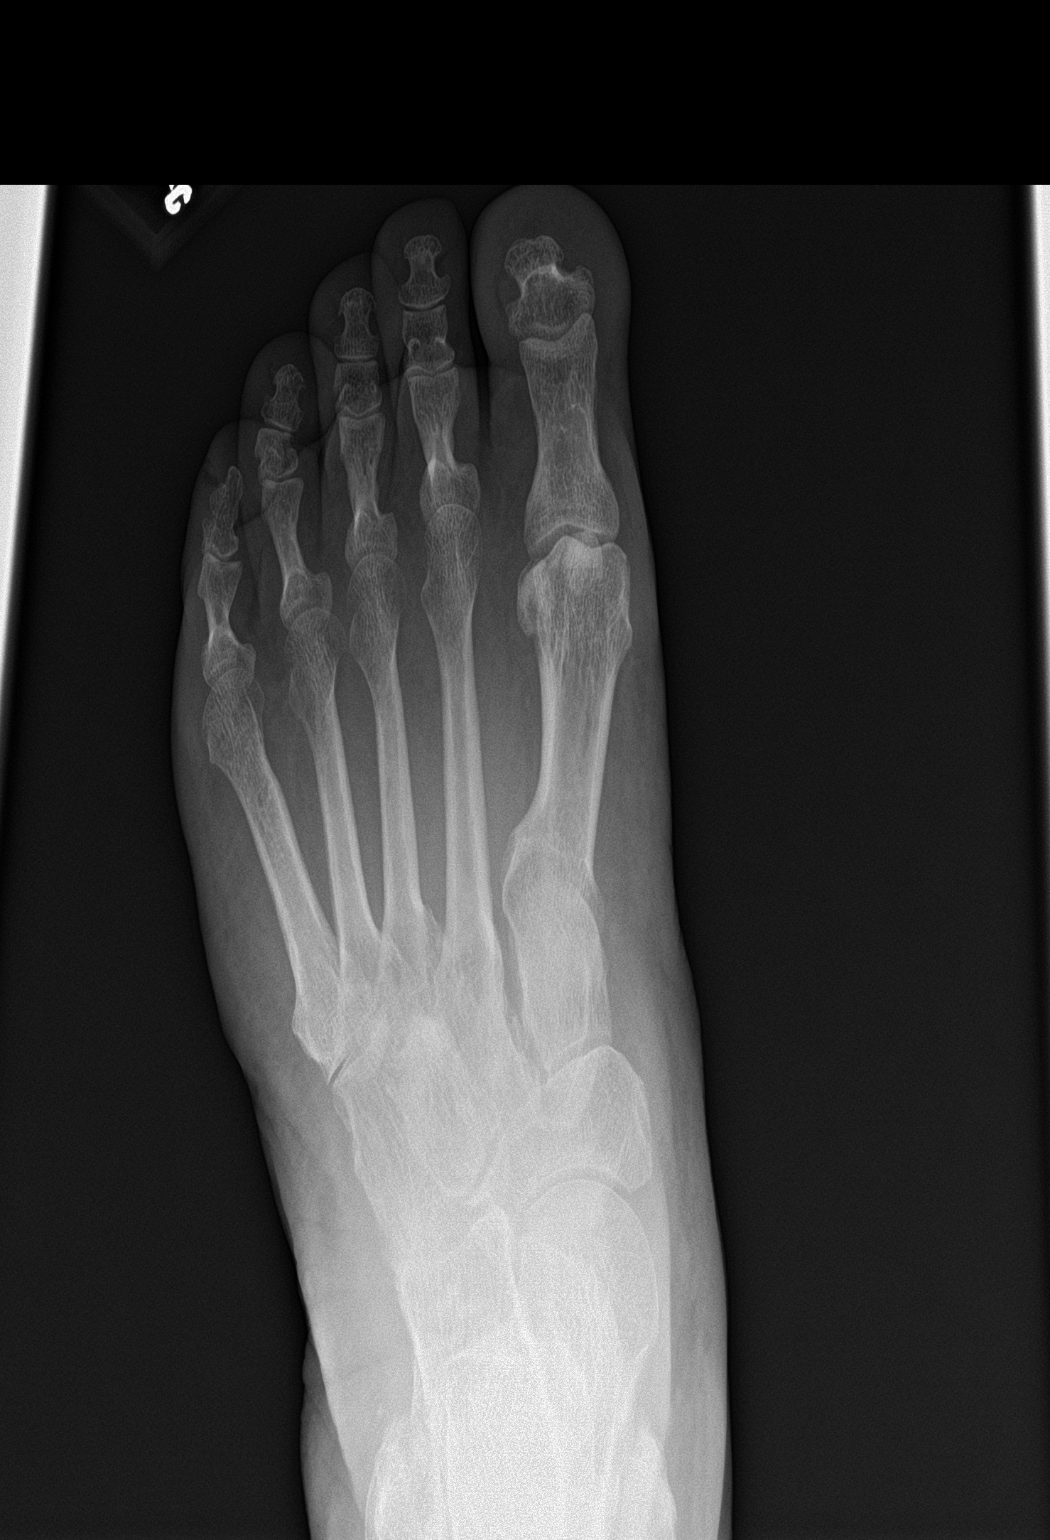

[foot obl]
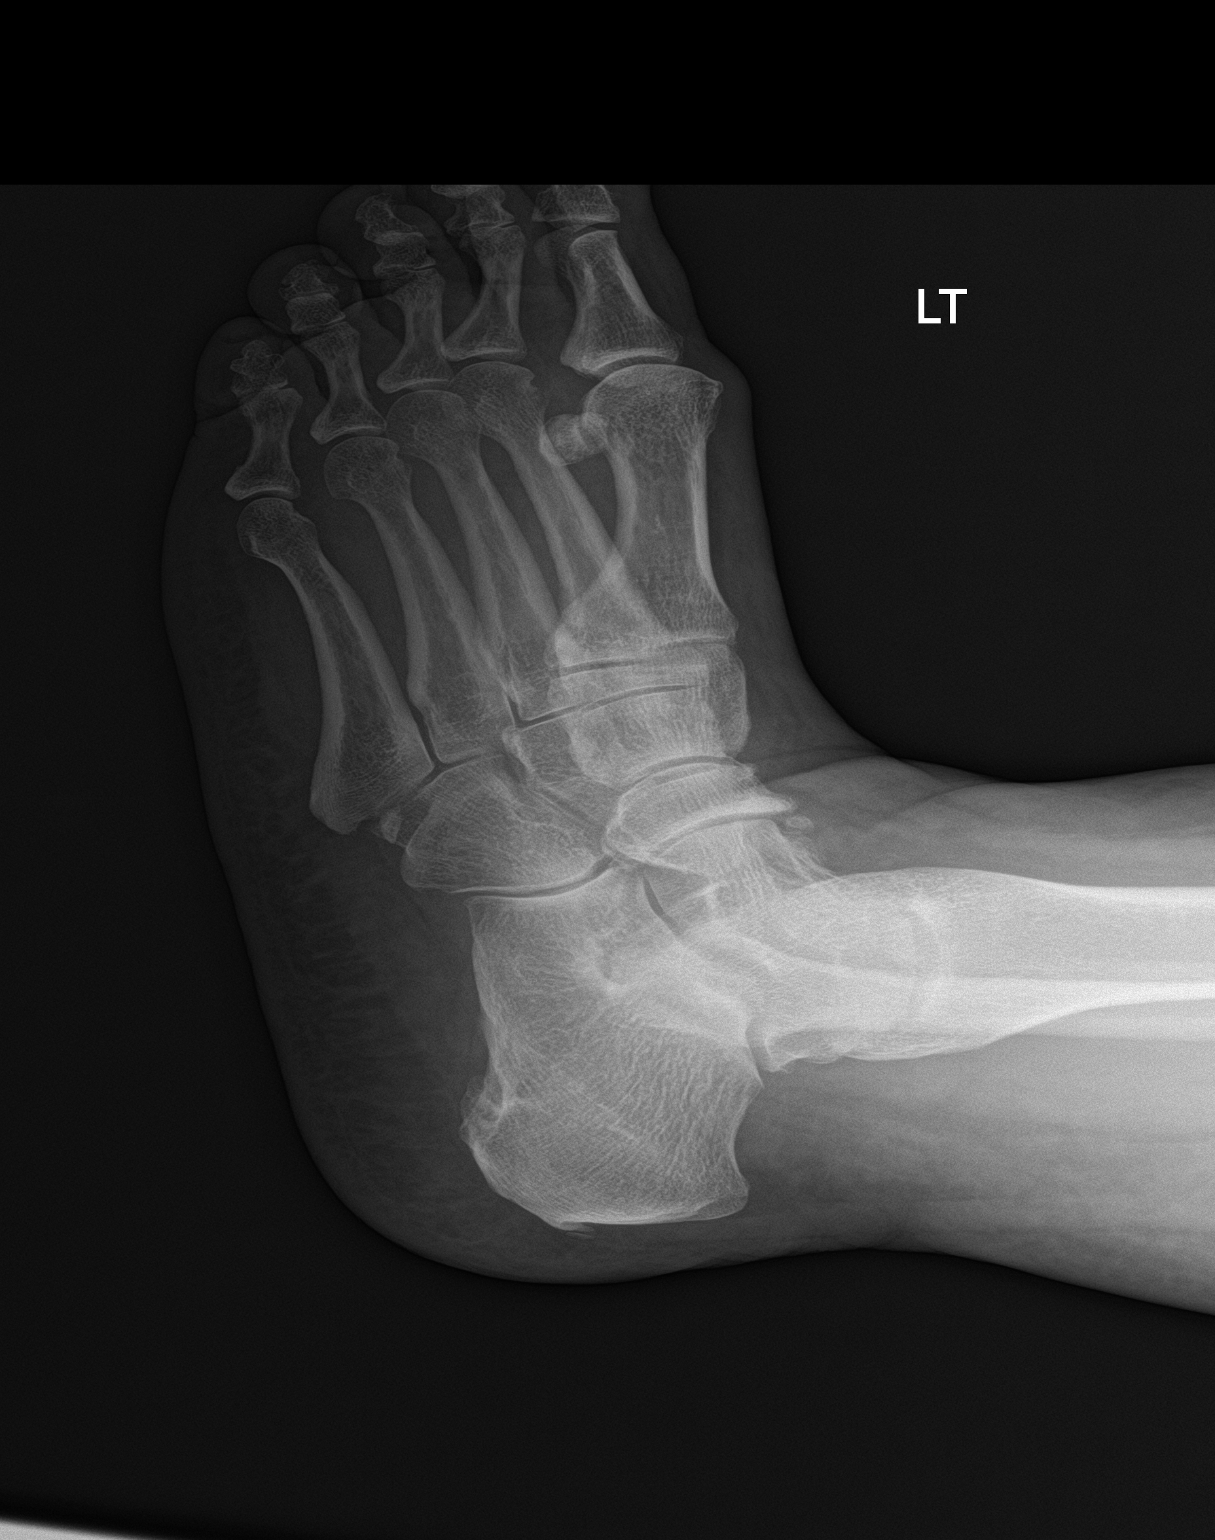

[foot lat]
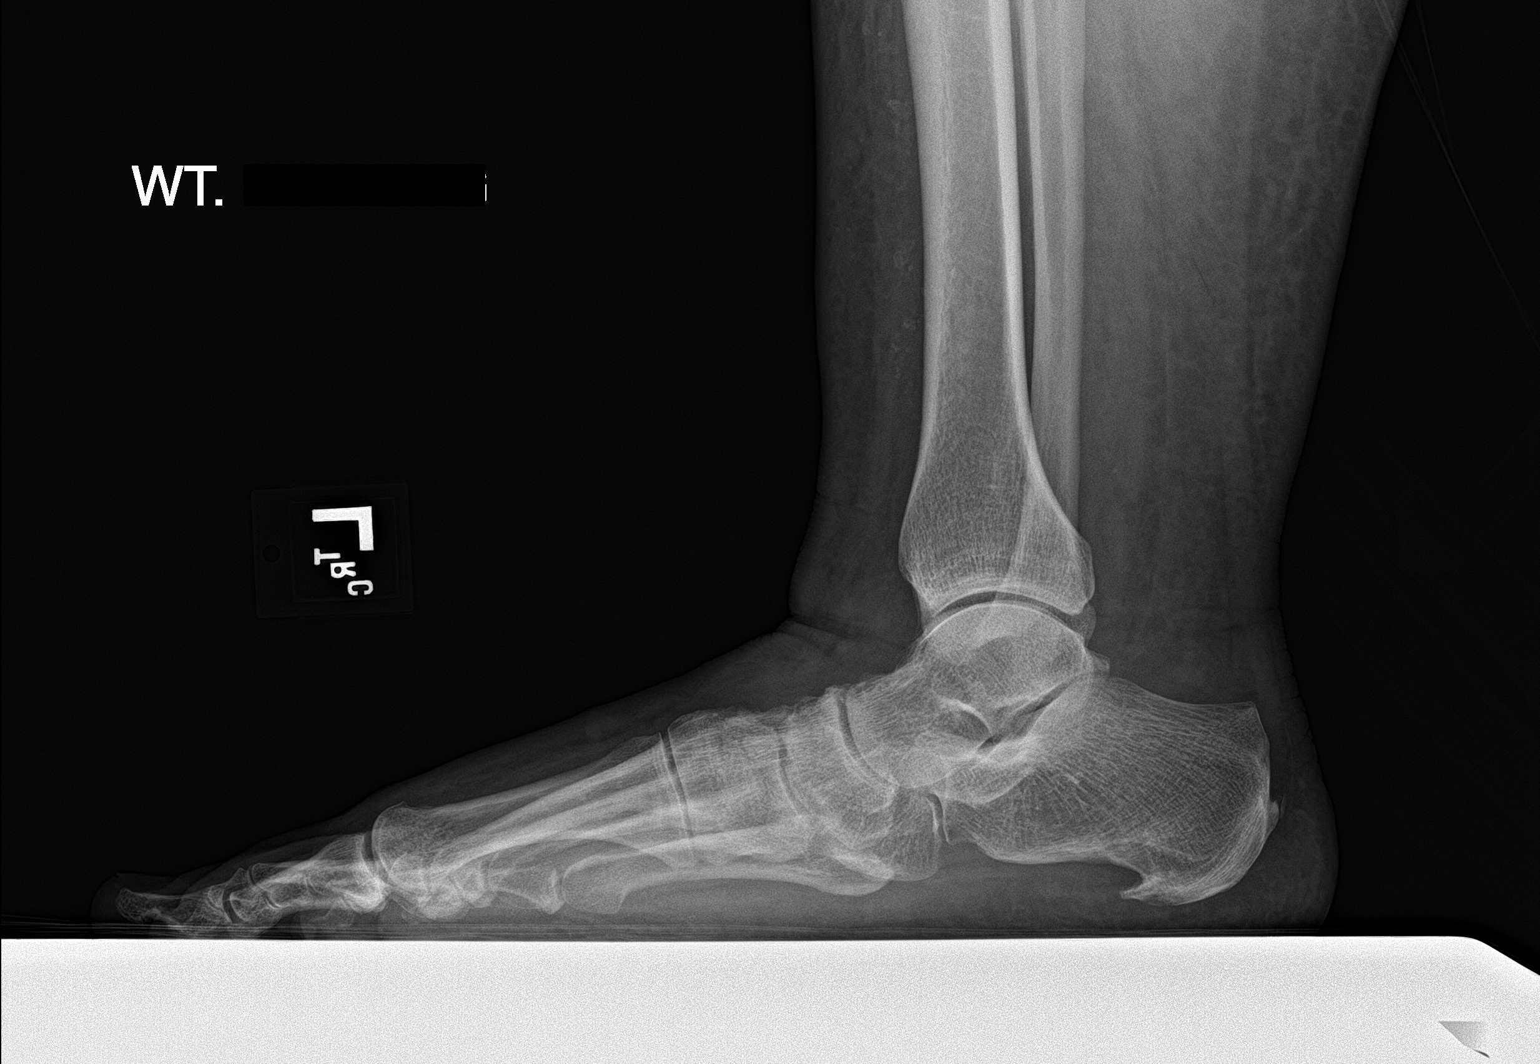

[3 of 3 positions shown; findings below may reference images not displayed]

FINDINGS: Mild pes planus. Mild dorsal talonavicular and navicular-cuneiform
degenerative osteophytes. Mild-to-moderate plantar calcaneal heel
spur. Tiny spur at the Achilles insertion on the calcaneus.
IMPRESSION: No acute fracture or dislocation.

## 2022-12-21 ENCOUNTER — Telehealth: Payer: Self-pay | Admitting: Family Medicine

## 2022-12-21 NOTE — Telephone Encounter (Signed)
Patient called states has moved to [place] to Live & needs her medical  records mailed to her --she also request CD is MRI images burned from [facility] 2/24 23 & 01/13/22 advised pt she would have to call Rad Dept @ each site to request CDs & gave her their office phone#.  --Lorain Childes
